# Patient Record
Sex: Female | Born: 1935 | Race: White | Hispanic: No | Smoking: Never smoker
Health system: Southern US, Community
[De-identification: ages and names within clinical notes are randomized; demographics above are authoritative.]

## PROBLEM LIST (undated history)

## (undated) DIAGNOSIS — J189 Pneumonia, unspecified organism: Secondary | ICD-10-CM

## (undated) DIAGNOSIS — K56609 Unspecified intestinal obstruction, unspecified as to partial versus complete obstruction: Secondary | ICD-10-CM

## (undated) DIAGNOSIS — D649 Anemia, unspecified: Secondary | ICD-10-CM

## (undated) DIAGNOSIS — F419 Anxiety disorder, unspecified: Secondary | ICD-10-CM

## (undated) DIAGNOSIS — C4330 Malignant melanoma of unspecified part of face: Secondary | ICD-10-CM

## (undated) DIAGNOSIS — G8929 Other chronic pain: Secondary | ICD-10-CM

## (undated) DIAGNOSIS — Z9289 Personal history of other medical treatment: Secondary | ICD-10-CM

## (undated) DIAGNOSIS — M1990 Unspecified osteoarthritis, unspecified site: Secondary | ICD-10-CM

## (undated) DIAGNOSIS — M545 Low back pain, unspecified: Secondary | ICD-10-CM

## (undated) HISTORY — PX: MELANOMA EXCISION: SHX5266

## (undated) HISTORY — PX: ABDOMINAL EXPLORATION SURGERY: SHX538

---

## 2001-07-04 ENCOUNTER — Ambulatory Visit (HOSPITAL_COMMUNITY): Admission: RE | Admit: 2001-07-04 | Discharge: 2001-07-04 | Payer: Self-pay | Admitting: Gastroenterology

## 2001-09-12 ENCOUNTER — Ambulatory Visit (HOSPITAL_COMMUNITY): Admission: RE | Admit: 2001-09-12 | Discharge: 2001-09-12 | Payer: Self-pay | Admitting: Ophthalmology

## 2003-07-18 ENCOUNTER — Encounter: Payer: Self-pay | Admitting: Ophthalmology

## 2003-07-23 ENCOUNTER — Ambulatory Visit (HOSPITAL_COMMUNITY): Admission: RE | Admit: 2003-07-23 | Discharge: 2003-07-23 | Payer: Self-pay | Admitting: Ophthalmology

## 2004-01-11 ENCOUNTER — Inpatient Hospital Stay (HOSPITAL_COMMUNITY): Admission: EM | Admit: 2004-01-11 | Discharge: 2004-01-17 | Payer: Self-pay | Admitting: *Deleted

## 2004-01-30 ENCOUNTER — Inpatient Hospital Stay (HOSPITAL_COMMUNITY): Admission: EM | Admit: 2004-01-30 | Discharge: 2004-02-12 | Payer: Self-pay | Admitting: Emergency Medicine

## 2004-02-20 ENCOUNTER — Encounter: Admission: RE | Admit: 2004-02-20 | Discharge: 2004-02-20 | Payer: Self-pay | Admitting: Gastroenterology

## 2004-07-07 ENCOUNTER — Inpatient Hospital Stay (HOSPITAL_COMMUNITY): Admission: EM | Admit: 2004-07-07 | Discharge: 2004-07-10 | Payer: Self-pay | Admitting: Physical Therapy

## 2004-08-10 ENCOUNTER — Ambulatory Visit (HOSPITAL_BASED_OUTPATIENT_CLINIC_OR_DEPARTMENT_OTHER): Admission: RE | Admit: 2004-08-10 | Discharge: 2004-08-10 | Payer: Self-pay | Admitting: Plastic Surgery

## 2004-08-10 ENCOUNTER — Ambulatory Visit (HOSPITAL_COMMUNITY): Admission: RE | Admit: 2004-08-10 | Discharge: 2004-08-10 | Payer: Self-pay | Admitting: Plastic Surgery

## 2004-10-26 ENCOUNTER — Other Ambulatory Visit: Admission: RE | Admit: 2004-10-26 | Discharge: 2004-10-26 | Payer: Self-pay | Admitting: Internal Medicine

## 2015-05-19 ENCOUNTER — Inpatient Hospital Stay (HOSPITAL_COMMUNITY)
Admission: EM | Admit: 2015-05-19 | Discharge: 2015-05-22 | DRG: 379 | Disposition: A | Discharge status: Skilled Nursing Facility | Payer: Medicare Other | Attending: Internal Medicine | Admitting: Internal Medicine

## 2015-05-19 ENCOUNTER — Encounter (HOSPITAL_COMMUNITY): Payer: Self-pay | Admitting: Emergency Medicine

## 2015-05-19 ENCOUNTER — Emergency Department (HOSPITAL_COMMUNITY): Payer: Medicare Other

## 2015-05-19 DIAGNOSIS — K922 Gastrointestinal hemorrhage, unspecified: Secondary | ICD-10-CM | POA: Diagnosis present

## 2015-05-19 DIAGNOSIS — D500 Iron deficiency anemia secondary to blood loss (chronic): Secondary | ICD-10-CM

## 2015-05-19 DIAGNOSIS — S51011A Laceration without foreign body of right elbow, initial encounter: Secondary | ICD-10-CM | POA: Diagnosis present

## 2015-05-19 DIAGNOSIS — Z66 Do not resuscitate: Secondary | ICD-10-CM | POA: Diagnosis present

## 2015-05-19 DIAGNOSIS — G8929 Other chronic pain: Secondary | ICD-10-CM | POA: Diagnosis present

## 2015-05-19 DIAGNOSIS — R55 Syncope and collapse: Secondary | ICD-10-CM | POA: Diagnosis not present

## 2015-05-19 DIAGNOSIS — M545 Low back pain: Secondary | ICD-10-CM | POA: Diagnosis present

## 2015-05-19 DIAGNOSIS — Z85820 Personal history of malignant melanoma of skin: Secondary | ICD-10-CM | POA: Diagnosis not present

## 2015-05-19 DIAGNOSIS — Z9289 Personal history of other medical treatment: Secondary | ICD-10-CM

## 2015-05-19 DIAGNOSIS — W19XXXA Unspecified fall, initial encounter: Secondary | ICD-10-CM | POA: Diagnosis present

## 2015-05-19 DIAGNOSIS — M62838 Other muscle spasm: Secondary | ICD-10-CM | POA: Diagnosis present

## 2015-05-19 DIAGNOSIS — F0390 Unspecified dementia without behavioral disturbance: Secondary | ICD-10-CM | POA: Diagnosis present

## 2015-05-19 DIAGNOSIS — D62 Acute posthemorrhagic anemia: Secondary | ICD-10-CM | POA: Diagnosis not present

## 2015-05-19 DIAGNOSIS — T39395A Adverse effect of other nonsteroidal anti-inflammatory drugs [NSAID], initial encounter: Secondary | ICD-10-CM | POA: Diagnosis present

## 2015-05-19 DIAGNOSIS — Y92009 Unspecified place in unspecified non-institutional (private) residence as the place of occurrence of the external cause: Secondary | ICD-10-CM

## 2015-05-19 DIAGNOSIS — M549 Dorsalgia, unspecified: Secondary | ICD-10-CM | POA: Insufficient documentation

## 2015-05-19 DIAGNOSIS — W102XXA Fall (on)(from) incline, initial encounter: Secondary | ICD-10-CM | POA: Insufficient documentation

## 2015-05-19 DIAGNOSIS — Z96642 Presence of left artificial hip joint: Secondary | ICD-10-CM | POA: Diagnosis present

## 2015-05-19 DIAGNOSIS — D649 Anemia, unspecified: Secondary | ICD-10-CM | POA: Diagnosis present

## 2015-05-19 HISTORY — DX: Pneumonia, unspecified organism: J18.9

## 2015-05-19 HISTORY — DX: Malignant melanoma of unspecified part of face: C43.30

## 2015-05-19 HISTORY — DX: Anemia, unspecified: D64.9

## 2015-05-19 HISTORY — DX: Low back pain, unspecified: M54.50

## 2015-05-19 HISTORY — DX: Personal history of other medical treatment: Z92.89

## 2015-05-19 HISTORY — DX: Other chronic pain: G89.29

## 2015-05-19 HISTORY — DX: Low back pain: M54.5

## 2015-05-19 HISTORY — DX: Unspecified osteoarthritis, unspecified site: M19.90

## 2015-05-19 HISTORY — DX: Anxiety disorder, unspecified: F41.9

## 2015-05-19 LAB — URINALYSIS, ROUTINE W REFLEX MICROSCOPIC
Bilirubin Urine: NEGATIVE
Glucose, UA: NEGATIVE mg/dL
HGB URINE DIPSTICK: NEGATIVE
KETONES UR: NEGATIVE mg/dL
LEUKOCYTES UA: NEGATIVE
Nitrite: NEGATIVE
Protein, ur: NEGATIVE mg/dL
Specific Gravity, Urine: 1.019 (ref 1.005–1.030)
Urobilinogen, UA: 0.2 mg/dL (ref 0.0–1.0)
pH: 7 (ref 5.0–8.0)

## 2015-05-19 LAB — CBC WITH DIFFERENTIAL/PLATELET
BASOS ABS: 0 10*3/uL (ref 0.0–0.1)
BASOS PCT: 0 % (ref 0–1)
EOS ABS: 0 10*3/uL (ref 0.0–0.7)
Eosinophils Relative: 0 % (ref 0–5)
HEMATOCRIT: 20.3 % — AB (ref 36.0–46.0)
Hemoglobin: 6.4 g/dL — CL (ref 12.0–15.0)
LYMPHS ABS: 0.5 10*3/uL — AB (ref 0.7–4.0)
LYMPHS PCT: 8 % — AB (ref 12–46)
MCH: 26.4 pg (ref 26.0–34.0)
MCHC: 31.5 g/dL (ref 30.0–36.0)
MCV: 83.9 fL (ref 78.0–100.0)
MONOS PCT: 4 % (ref 3–12)
Monocytes Absolute: 0.3 10*3/uL (ref 0.1–1.0)
NEUTROS ABS: 5.6 10*3/uL (ref 1.7–7.7)
Neutrophils Relative %: 88 % — ABNORMAL HIGH (ref 43–77)
PLATELETS: 253 10*3/uL (ref 150–400)
RBC: 2.42 MIL/uL — AB (ref 3.87–5.11)
RDW: 14 % (ref 11.5–15.5)
WBC: 6.3 10*3/uL (ref 4.0–10.5)

## 2015-05-19 LAB — COMPREHENSIVE METABOLIC PANEL
ALBUMIN: 2.7 g/dL — AB (ref 3.5–5.0)
ALK PHOS: 57 U/L (ref 38–126)
ALT: 11 U/L — AB (ref 14–54)
AST: 18 U/L (ref 15–41)
Anion gap: 6 (ref 5–15)
BUN: 11 mg/dL (ref 6–20)
CALCIUM: 7.7 mg/dL — AB (ref 8.9–10.3)
CO2: 24 mmol/L (ref 22–32)
Chloride: 99 mmol/L — ABNORMAL LOW (ref 101–111)
Creatinine, Ser: 1.06 mg/dL — ABNORMAL HIGH (ref 0.44–1.00)
GFR calc Af Amer: 50 mL/min — ABNORMAL LOW (ref >60)
GFR calc non Af Amer: 44 mL/min — ABNORMAL LOW (ref >60)
GLUCOSE: 134 mg/dL — AB (ref 65–99)
POTASSIUM: 4.8 mmol/L (ref 3.5–5.1)
SODIUM: 129 mmol/L — AB (ref 135–145)
TOTAL PROTEIN: 4.6 g/dL — AB (ref 6.5–8.1)
Total Bilirubin: 0.3 mg/dL (ref 0.3–1.2)

## 2015-05-19 LAB — RETICULOCYTES
RBC.: 2.8 MIL/uL — ABNORMAL LOW (ref 3.87–5.11)
RETIC COUNT ABSOLUTE: 36.4 10*3/uL (ref 19.0–186.0)
Retic Ct Pct: 1.3 % (ref 0.4–3.1)

## 2015-05-19 LAB — OCCULT BLOOD, POC DEVICE: Fecal Occult Bld: POSITIVE — AB

## 2015-05-19 LAB — CBG MONITORING, ED: GLUCOSE-CAPILLARY: 141 mg/dL — AB (ref 65–99)

## 2015-05-19 LAB — PREPARE RBC (CROSSMATCH)

## 2015-05-19 LAB — ABO/RH: ABO/RH(D): O POS

## 2015-05-19 MED ORDER — SODIUM CHLORIDE 0.9 % IV SOLN: 10.0000 mL/h | Freq: Once | INTRAVENOUS | Status: DC

## 2015-05-19 MED ORDER — SODIUM CHLORIDE 0.9 % IV BOLUS (SEPSIS)
1000.0000 mL | Freq: Once | INTRAVENOUS | Status: AC
  Administered 2015-05-19: 1000 mL via INTRAVENOUS

## 2015-05-19 MED ORDER — ACETAMINOPHEN 650 MG RE SUPP: 650.0000 mg | Freq: Four times a day (QID) | RECTAL | Status: DC | PRN

## 2015-05-19 MED ORDER — TRAMADOL HCL 50 MG PO TABS
50.0000 mg | ORAL_TABLET | Freq: Two times a day (BID) | ORAL | Status: DC | PRN
  Administered 2015-05-20 – 2015-05-21 (×2): 50 mg via ORAL
  Filled 2015-05-19 (×3): qty 1

## 2015-05-19 MED ORDER — ACETAMINOPHEN 325 MG PO TABS
650.0000 mg | ORAL_TABLET | Freq: Four times a day (QID) | ORAL | Status: DC | PRN
Start: 2015-05-19 — End: 2015-05-22
  Administered 2015-05-19: 650 mg via ORAL
  Filled 2015-05-19 (×2): qty 2

## 2015-05-19 MED ORDER — SODIUM CHLORIDE 0.9 % IJ SOLN
3.0000 mL | Freq: Two times a day (BID) | INTRAMUSCULAR | Status: DC
  Administered 2015-05-19 – 2015-05-22 (×5): 3 mL via INTRAVENOUS

## 2015-05-19 MED ORDER — SODIUM CHLORIDE 0.9 % IV SOLN
INTRAVENOUS | Status: AC
  Administered 2015-05-19 – 2015-05-20 (×2): via INTRAVENOUS

## 2015-05-19 NOTE — ED Notes (Signed)
Admitting at bedside 

## 2015-05-19 NOTE — ED Provider Notes (Signed)
I saw and evaluated the patient, reviewed the resident's note and I agree with the findings and plan.   EKG Interpretation   Date/Time:  Monday May 19 2015 18:25:49 EDT Ventricular Rate:  64 PR Interval:  125 QRS Duration: 73 QT Interval:  432 QTC Calculation: 446 R Axis:   28 Text Interpretation:  Sinus rhythm Anteroseptal infarct, old ED PHYSICIAN  INTERPRETATION AVAILABLE IN [facility] [place] Confirmed by TEST, Record  ([id number]) on 05/20/2015 6:46:28 AM      89yF with syncope? Found down at home. Pt not sure of exact circumstances. Did get a dose of zanaflex for first time today for back pain though.  Second fall in past week. Also noted to have some confusion which began last week. Diagnosed with UTI at that time. Tx'd and confusion improved up until today. Just not as quick to respond to questions as typically is. W/u significant for hemoglobin of 6.4. This certainly could be contributing. Will transfuse. Admission.  CRITICAL CARE Performed by: Virgel Manifold Total critical care time: 35 minutes Critical care time was exclusive of separately billable procedures and treating other patients. Critical care was necessary to treat or prevent imminent or life-threatening deterioration. Critical care was time spent personally by me on the following activities: development of treatment plan with patient and/or surrogate as well as nursing, discussions with consultants, evaluation of patient's response to treatment, examination of patient, obtaining history from patient or surrogate, ordering and performing treatments and interventions, ordering and review of laboratory studies, ordering and review of radiographic studies, pulse oximetry and re-evaluation of patient's condition.   Virgel Manifold, MD 05/21/15 [phone]

## 2015-05-19 NOTE — ED Notes (Signed)
Pt went to MD office today for possible UTI. Pt placed on anitbiotic and zanaflex. Pt is not on any other medications. Pt's family left  Her this morning then came home and found her unresponsive on kitchen floor. Pt took 1 zanaflex. Family was able to help pt up and sit her in chair. Pt became more responsive- eye opening. Pt was bagged by fire at some points- pt would stop breathing without stimulation. Pupils constricted. EMS gave 0.5mg  narcan. Pt seemed to respond more. Pt had second dose of narcan 0.5mg . Pt unable to recall the year, but is able to answer all other questions now. Pt is very lethargic upon arrival. Pt complaining of her neck feeling "tired." Pt has skin tears on right elbow and left forearm. BP 70/42 initially for EMS, HR 50 CBG 158

## 2015-05-19 NOTE — ED Notes (Signed)
Phlebotomy made aware this RN unable to pull blood from IV.

## 2015-05-19 NOTE — ED Notes (Signed)
MD at bedside updating family.  

## 2015-05-19 NOTE — ED Provider Notes (Signed)
History   Chief Complaint  Patient presents with  . Fall    HPI 81 year old female past history of dementia presents to ED for evaluation of fall. Patient arrives via EMS. EMS reports patient was found to have initial blood pressure in the 91M systolic and was given IV fluids. She had small skin tears to her right elbow and left forearm. Patient appeared confused. Patient remained stable during transport. Patient is complaining of neck pain and has no other complaints currently. Patient's niece is present in ED and says that the patient's other niece found patient at home after she fell today. She noted that the patient was confused. She says around 3:00 she gave the patient Xanaflex for the first time. Patient does not have access to any narcotics. Patient has never had Xanaflex in the past and this was just recently prescribed by her PCP for back pain. Additionally she notes the patient was recently diagnosed with UTI. She said patient appeared slightly dazed and confused. Niece is present in ED now since patient is acting more normal now. She notes that patient has long-standing history of back pain as well as neck pain. She thinks patient's speech appears normal. History is limited secondary patient's confusion and history of dementia.   Past medical/surgical history, social history, medications, allergies and FH have been reviewed with patient and/or in documentation.  History reviewed. No pertinent past medical history. History reviewed. No pertinent past surgical history. No family history on file. History  Substance Use Topics  . Smoking status: Never Smoker   . Smokeless tobacco: Not on file  . Alcohol Use: No     Review of Systems Unable to obtain secondary to patient's altered mental status  Physical Exam  Physical Exam ED Triage Vitals  Enc Vitals Group     BP 05/19/15 1809 94/53 mmHg     Pulse Rate 05/19/15 1809 65     Resp 05/19/15 1809 16     Temp 05/19/15 1809 97.4  F (36.3 C)     Temp Source 05/19/15 1809 Oral     SpO2 05/19/15 1809 99 %     Weight 05/19/15 1815 96 lb (43.545 kg)     Height 05/19/15 1815 5\' 3"  (1.6 m)     Head Cir --      Peak Flow --      Pain Score --      Pain Loc --      Pain Edu? --      Excl. in [place]? --     General: awake. Alert and oriented to person and place only. Well-hydrated. Appears confused.  HENT:  [place]/AT and no palpable skull defect; pupils 2 mm, equal, round, reactive; EOMs intact. No signs of ocular entrapment, Battle sign, raccoon eyes, nasal septal hematoma, hemotympanum, midface instability or deformity, apparent oral injury Neck: supple, trachea midline, cervical collar in place, no midline C spine ttp. Mild right lateral posterior neck tenderness to palpation.  Cardio: RRR.  No JVD.  2+ pulses in bilateral upper and lower extremities. No peripheral edema. Pulm:   CTAB, no r/r/g. Normal respiratory effort Chest wall: stable to AP/LAT compression, chest wall non-tender, no obvious clavicle deformity Abd: soft, NT/ND. MSK: Extremities have FROM, are nontender, & NVI distally. Pt has skin tears to R elbow and L forearm. Spine: without obvious step off, tenderness or signs of injury.  Neuro: GCS 14. No focal deficit. Normal strength/sensation/muscle tone.   ED Course  Procedures   Labs Reviewed  CBC WITH DIFFERENTIAL/PLATELET - Abnormal; Notable for the following:    RBC 2.42 (*)    Hemoglobin 6.4 (*)    HCT 20.3 (*)    Neutrophils Relative % 88 (*)    Lymphocytes Relative 8 (*)    Lymphs Abs 0.5 (*)    All other components within normal limits  COMPREHENSIVE METABOLIC PANEL - Abnormal; Notable for the following:    Sodium 129 (*)    Chloride 99 (*)    Glucose, Bld 134 (*)    Creatinine, Ser 1.06 (*)    Calcium 7.7 (*)    Total Protein 4.6 (*)    Albumin 2.7 (*)    ALT 11 (*)    GFR calc non Af Amer 44 (*)    GFR calc Af Amer 50 (*)    All other components within normal limits  RETICULOCYTES -  Abnormal; Notable for the following:    RBC. 2.80 (*)    All other components within normal limits  IRON AND TIBC - Abnormal; Notable for the following:    Iron 9 (*)    Saturation Ratios 3 (*)    All other components within normal limits  CBG MONITORING, ED - Abnormal; Notable for the following:    Glucose-Capillary 141 (*)    All other components within normal limits  OCCULT BLOOD, POC DEVICE - Abnormal; Notable for the following:    Fecal Occult Bld POSITIVE (*)    All other components within normal limits  URINALYSIS, ROUTINE W REFLEX MICROSCOPIC (NOT AT [facility])  FERRITIN  BASIC METABOLIC PANEL  CBC  PROTIME-INR  POC OCCULT BLOOD, ED  PREPARE RBC (CROSSMATCH)  TYPE AND SCREEN  ABO/RH   I personally reviewed and interpreted all labs.  CT Head Wo Contrast    (Results Pending)  CT Cervical Spine Wo Contrast    (Results Pending)   I personally viewed above image(s) which were used in my medical decision making. Formal interpretations by Radiology.  MDM:  On arrival, patient is hemodynamically stable and in no apparent distress. She is confused. Her initial blood pressure was 96 systolic. IV fluids were started. Patient had a benign exam as above with minor skin tears to extremities. No trauma to head. Given her fall history a CT head and C-spine were ordered. Screening labs were sent. Initial EKG was unremarkable. Patient's screening labs are notable for hemoglobin of 6.4. Patient has chronic dark stools as she is on iron for anemia. No change in that per family. Hemoccults sent and pending. Patient blood pressure after fluids were 016W systolic. Patient received 1 unit of packed red blood cells in ED. Imaging was negative for trauma or other abnormality. Patient will be admitted to internal medicine teaching service.  Clinical Impression: 1. Anemia, unspecified anemia type   2. Fall (on)(from) incline, initial encounter     Disposition: Admit  Condition: stable  I have  discussed the results, Dx and Tx plan with the pt(& family if present). He/she/they expressed understanding and agree(s) with the plan.  Pt seen in conjunction with Dr. Benny Lennert, [place] [facility] Resident - PGY-3     Kirstie Peri, MD 05/20/15 1093  Virgel Manifold, MD 05/20/15 [phone]

## 2015-05-19 NOTE — ED Notes (Signed)
Phlebotomy at bedside.

## 2015-05-19 NOTE — ED Notes (Signed)
MD made aware of Hgb and Hct.

## 2015-05-19 NOTE — ED Notes (Signed)
MD made aware CT results are back and that pt's family have been discussing with her in the room about the need to sign a DNR.

## 2015-05-19 NOTE — H&P (Signed)
Date: 05/20/2015               Patient Name:  Kelly Porter MRN: [id number]  DOB: [birth year] Age / Sex: 81 y.o., female   PCP: Josetta Huddle, MD         Medical Service: Internal Medicine Teaching Service         Attending Physician: Dr. Oval Linsey, MD    First Contact: Kalman Shan, MS-IV Pager: [phone]  Second Contact: Dr. Duwaine Maxin Pager: [phone]       After Hours (After 5p/  First Contact Pager: [phone]  weekends / holidays): Second Contact Pager: [phone]   Chief Complaint: Syncope and anemia    History of Present Illness: Ms. Kelly Porter is a 81 y.o. female with a past medical history of dementia, diverticulosis who presents to the emergency department with syncope.  HPI is provided by both the patient and the patients niece who was present with [name].  Per patients niece, patient has been increasingly weak, fatigued, and confused for approximately the past month.  Reports that she has also had a couple of falls during this time associated with dizziness and being light-headed.  Also reports last Saturday 7/23 an episode of watery, non-bloody diarrhea.  She was subsequently seen at an urgent care the following day where she had a hemoglobin of at least 9.  Today, on admission her hemoglobin is 6.4, hematocrit of 20.3.   Pt reports black stools recently but does not recall any bright red blood per rectum and no hematemesis.  To coincide with her increased fatigue and confusion, patient also complains of increased back pain for two weeks localized to the thoracolumbar area of her spine.  For this she was recently seen by her physician and given prescription for Zanaflex.  Today, patient took her Zanaflex as prescribed and was unattended for a period of 45-60 minutes at which point she was found on the ground by relatives unable to get up.  During the exam patient was alert and oriented to person only.  Meds: Current Facility-Administered Medications  Medication Dose Route  Frequency Provider Last Rate Last Dose  . 0.9 %  sodium chloride infusion   Intravenous Continuous Corky Sox, MD 75 mL/hr at 05/19/15 2215    . acetaminophen (TYLENOL) tablet 650 mg  650 mg Oral Q6H PRN Corky Sox, MD   650 mg at 05/19/15 2229   Or  . acetaminophen (TYLENOL) suppository 650 mg  650 mg Rectal Q6H PRN Corky Sox, MD      . antiseptic oral rinse (CPC / CETYLPYRIDINIUM CHLORIDE 0.05%) solution 7 mL  7 mL Mouth Rinse BID Oval Linsey, MD   7 mL at 05/20/15 0246  . sodium chloride 0.9 % injection 3 mL  3 mL Intravenous Q12H Corky Sox, MD   3 mL at 05/19/15 2215  . traMADol (ULTRAM) tablet 50 mg  50 mg Oral Q12H PRN Corky Sox, MD   50 mg at 05/20/15 0045    Allergies: Allergies as of 05/19/2015  . (No Known Allergies)   Past Medical History  Diagnosis Date  . Pneumonia X 1  . History of blood transfusion 05/19/2015    "low HgB"  . Anemia "always"  . Arthritis     "back" (05/19/2015)  . Chronic lower back pain     "qd for the last month" (05/19/2015)  . Anxiety   . Melanoma of face     "skin only;  no other treatment"   Past Surgical History  Procedure Laterality Date  . Abdominal exploration surgery      "thought it was her ovaries; found diverticulitis"  . Melanoma excision      face   History reviewed. No pertinent family history. History   Social History  . Marital Status: Widowed    Spouse Name: N/A  . Number of Children: N/A  . Years of Education: N/A   Occupational History  . Not on file.   Social History Main Topics  . Smoking status: Never Smoker   . Smokeless tobacco: Never Used  . Alcohol Use: No  . Drug Use: No  . Sexual Activity: No   Other Topics Concern  . Not on file   Social History Narrative  . No narrative on file    Review of Systems  Constitutional: Positive for malaise/fatigue. Negative for fever and chills.  Respiratory: Negative for cough, hemoptysis and shortness of breath.   Cardiovascular: Negative for chest  pain and leg swelling.  Gastrointestinal: Positive for diarrhea and melena. Negative for vomiting and abdominal pain.  Genitourinary: Negative for dysuria.  Musculoskeletal: Positive for back pain.  Skin:       Skin laceration on left forearm and right elbow.  Neurological: Positive for dizziness.  Psychiatric/Behavioral: Positive for memory loss.    Physical Exam: Blood pressure 143/67, pulse 81, temperature 97.5 F (36.4 C), temperature source Oral, resp. rate 19, height 5\' 3"  (1.6 m), weight 102 lb (46.267 kg), SpO2 98 %. Physical Exam  Constitutional: She appears well-developed and well-nourished. No distress.  HENT:  Head: Normocephalic and atraumatic.  Cardiovascular: Normal rate, regular rhythm, normal heart sounds and intact distal pulses.   Pulmonary/Chest: Effort normal and breath sounds normal.  Abdominal: Soft. Bowel sounds are normal. She exhibits no distension. There is no tenderness.  Musculoskeletal: She exhibits tenderness. She exhibits no edema.  Thoracolumbar tenderness to palpation and pain with movement while lying in the hospital bed.  Neurological: She is alert. She has normal strength. She displays normal reflexes. No cranial nerve deficit.  Oriented only to person. Normal finger-to-nose.  Skin:  Skin laceration visible on right elbow, left forearm.  Ecchymosis noted on bilateral lower extremities that is unchanged.    Imaging results:  Ct Head Wo Contrast  05/19/2015   CLINICAL DATA:  Patient found at home unresponsive on floor, constricted pupils, skin tear is on right elbow and left forearm suggesting followup, on antibiotics for urinary tract infection  EXAM: CT HEAD WITHOUT CONTRAST  CT CERVICAL SPINE WITHOUT CONTRAST  TECHNIQUE: Multidetector CT imaging of the head and cervical spine was performed following the standard protocol without intravenous contrast. Multiplanar CT image reconstructions of the cervical spine were also generated.  COMPARISON:   01/12/2004  FINDINGS: CT HEAD FINDINGS  Severe atrophy. Moderate low attenuation in the deep white matter. No focal findings to suggest mass or transcortical infarct. No hemorrhage or extra-axial fluid. No hydrocephalus. No skull fracture.  CT CERVICAL SPINE FINDINGS  No cervical spine fracture. No acute soft tissue abnormalities. Carotid artery calcification on the right. Thyroid normal. Lung apices clear. Normal alignment. Multilevel degenerative disc disease.  IMPRESSION: No acute intracranial abnormalities.  No cervical spine fracture.   Electronically Signed   By: Skipper Cliche M.D.   On: 05/19/2015 19:41   Ct Cervical Spine Wo Contrast  05/19/2015   CLINICAL DATA:  Patient found at home unresponsive on floor, constricted pupils, skin tear is on right elbow and  left forearm suggesting followup, on antibiotics for urinary tract infection  EXAM: CT HEAD WITHOUT CONTRAST  CT CERVICAL SPINE WITHOUT CONTRAST  TECHNIQUE: Multidetector CT imaging of the head and cervical spine was performed following the standard protocol without intravenous contrast. Multiplanar CT image reconstructions of the cervical spine were also generated.  COMPARISON:  01/12/2004  FINDINGS: CT HEAD FINDINGS  Severe atrophy. Moderate low attenuation in the deep white matter. No focal findings to suggest mass or transcortical infarct. No hemorrhage or extra-axial fluid. No hydrocephalus. No skull fracture.  CT CERVICAL SPINE FINDINGS  No cervical spine fracture. No acute soft tissue abnormalities. Carotid artery calcification on the right. Thyroid normal. Lung apices clear. Normal alignment. Multilevel degenerative disc disease.  IMPRESSION: No acute intracranial abnormalities.  No cervical spine fracture.   Electronically Signed   By: Skipper Cliche M.D.   On: 05/19/2015 19:41    Assessment & Plan by Problem: Active Problems:   Anemia   Syncope  Anemia: -Patients niece reports hemoglobin last week of at least 9 with patient  reporting black stools, increased fatigue, dizziness and being light-headed x 1 month. -H/H on admission of 6.4/20.3.   -will give 1 unit RBC, NS IV fluids @ 22mL/hr. -check CBC, BMP, PT/INR in the AM.  Consideration for more RBC if hemoglobin still < 7.0. -FOBT positive, concern for GI source.  Consider GI consult in AM.  -Pt has hx of iron deficiency, on supplementation at home, borderline microcytic anemia with MCV 83.9.  Iron panel indicative of iron deficiency anemia with iron of 9 and normal ferritin.  After she is seen by GI, consider starting ferrous sulfate 325 tid.  Syncope: -CT Head and cervical spine without acute intracranial abnormalities or cervical spine fracture.   -patient found down at home.  Concern for orthostatic syncope due to prodrome of associated dizziness and lightheadedness.  Pt does not have history of cardiac disease. -order for orthostatic vitals in the AM. -neuro checks q4h. -hold Zanaflex.  Back Pain: -several months of increasing thoracolumbar pain tender to palpation and aggravated by movement, seen by outside physician, given rx for Zanaflex. -urinalysis does not show UTI. -acetaminophen 650mg  q6h prn for pain. -tramadol 50mg  q12h prn for pain. -hold Zanaflex for now 2/2 to potential relationship to syncopal episode. -PT/OT to evaluate and treat. -consider x-ray of thoracolumbar spine.  Dementia:  -patient is pleasantly demented but lives alone and is primary caretaker for herself. -consult to social work for possible skilled nursing home facility given patients decline in for the past month with confusion, weakness and fatigue.  DVT Prophlaxis: -SCDs  Diet: -clear diet  Code Status: -DNR (discussed with niece and patient at bedside)  Dispo: Disposition is deferred at this time, awaiting improvement of current medical problems. Anticipated discharge in approximately 2-3 day(s).   The patient does have a current PCP Josetta Huddle, MD) and does  not need an [facility] hospital follow-up appointment after discharge.  The patient does have transportation limitations that hinder transportation to clinic appointments.  Signed: Jule Ser, DO 05/20/2015, 4:19 AM

## 2015-05-19 NOTE — ED Notes (Signed)
Pt alert/oriented to baseline.

## 2015-05-20 ENCOUNTER — Inpatient Hospital Stay (HOSPITAL_COMMUNITY): Payer: Medicare Other

## 2015-05-20 DIAGNOSIS — K922 Gastrointestinal hemorrhage, unspecified: Principal | ICD-10-CM

## 2015-05-20 DIAGNOSIS — D62 Acute posthemorrhagic anemia: Secondary | ICD-10-CM

## 2015-05-20 DIAGNOSIS — R55 Syncope and collapse: Secondary | ICD-10-CM

## 2015-05-20 DIAGNOSIS — R296 Repeated falls: Secondary | ICD-10-CM

## 2015-05-20 DIAGNOSIS — Z9181 History of falling: Secondary | ICD-10-CM

## 2015-05-20 DIAGNOSIS — W102XXA Fall (on)(from) incline, initial encounter: Secondary | ICD-10-CM | POA: Insufficient documentation

## 2015-05-20 DIAGNOSIS — D500 Iron deficiency anemia secondary to blood loss (chronic): Secondary | ICD-10-CM

## 2015-05-20 DIAGNOSIS — F0390 Unspecified dementia without behavioral disturbance: Secondary | ICD-10-CM

## 2015-05-20 DIAGNOSIS — M545 Low back pain: Secondary | ICD-10-CM

## 2015-05-20 DIAGNOSIS — M549 Dorsalgia, unspecified: Secondary | ICD-10-CM | POA: Insufficient documentation

## 2015-05-20 LAB — PROTIME-INR
INR: 1.09 (ref 0.00–1.49)
PROTHROMBIN TIME: 14.3 s (ref 11.6–15.2)

## 2015-05-20 LAB — CBC
HCT: 25.3 % — ABNORMAL LOW (ref 36.0–46.0)
HEMOGLOBIN: 8.2 g/dL — AB (ref 12.0–15.0)
MCH: 27.6 pg (ref 26.0–34.0)
MCHC: 32.4 g/dL (ref 30.0–36.0)
MCV: 85.2 fL (ref 78.0–100.0)
Platelets: 262 10*3/uL (ref 150–400)
RBC: 2.97 MIL/uL — AB (ref 3.87–5.11)
RDW: 14.1 % (ref 11.5–15.5)
WBC: 6.7 10*3/uL (ref 4.0–10.5)

## 2015-05-20 LAB — BASIC METABOLIC PANEL
Anion gap: 8 (ref 5–15)
BUN: 12 mg/dL (ref 6–20)
CALCIUM: 7.8 mg/dL — AB (ref 8.9–10.3)
CO2: 22 mmol/L (ref 22–32)
Chloride: 102 mmol/L (ref 101–111)
Creatinine, Ser: 0.94 mg/dL (ref 0.44–1.00)
GFR calc non Af Amer: 50 mL/min — ABNORMAL LOW (ref >60)
GFR, EST AFRICAN AMERICAN: 58 mL/min — AB (ref >60)
GLUCOSE: 91 mg/dL (ref 65–99)
Potassium: 4 mmol/L (ref 3.5–5.1)
Sodium: 132 mmol/L — ABNORMAL LOW (ref 135–145)

## 2015-05-20 LAB — IRON AND TIBC
IRON: 9 ug/dL — AB (ref 28–170)
SATURATION RATIOS: 3 % — AB (ref 10.4–31.8)
TIBC: 330 ug/dL (ref 250–450)
UIBC: 321 ug/dL

## 2015-05-20 LAB — TYPE AND SCREEN
ABO/RH(D): O POS
Antibody Screen: NEGATIVE
UNIT DIVISION: 0

## 2015-05-20 LAB — FERRITIN: Ferritin: 14 ng/mL (ref 11–307)

## 2015-05-20 MED ORDER — PANTOPRAZOLE SODIUM 40 MG IV SOLR
40.0000 mg | Freq: Two times a day (BID) | INTRAVENOUS | Status: DC
  Administered 2015-05-20 – 2015-05-21 (×2): 40 mg via INTRAVENOUS
  Filled 2015-05-20 (×2): qty 40

## 2015-05-20 MED ORDER — SODIUM CHLORIDE 0.9 % IV SOLN
510.0000 mg | Freq: Once | INTRAVENOUS | Status: AC
  Administered 2015-05-20: 510 mg via INTRAVENOUS
  Filled 2015-05-20: qty 17

## 2015-05-20 MED ORDER — CETYLPYRIDINIUM CHLORIDE 0.05 % MT LIQD
7.0000 mL | Freq: Two times a day (BID) | OROMUCOSAL | Status: DC
  Administered 2015-05-20 – 2015-05-22 (×5): 7 mL via OROMUCOSAL

## 2015-05-20 NOTE — Evaluation (Signed)
Physical Therapy Evaluation Patient Details Name: CICELY ORTNER MRN: [id number] DOB: 1919/07/08 Today's Date: 05/20/2015   History of Present Illness  History of Present Illness: Ms. KARIANNE NOGUEIRA is a 81 y.o. female with a past medical history of dementia, diverticulosis who presents to the emergency department with syncope. HPI is provided by both the patient and the patients niece who was present with Ms. Theroux. Per patients niece, patient has been increasingly weak, fatigued, and confused for approximately the past month. Reports that she has also had a couple of falls during this time associated with dizziness and being light-headed. Also reports last Saturday 7/23 an episode of watery, non-bloody diarrhea. She was subsequently seen at an urgent care the following day where she had a hemoglobin of at least 9. On admission her hemoglobin is 6.4  Clinical Impression   Pt admitted with above diagnosis. Pt currently with functional limitations due to the deficits listed below (see PT Problem List).  Pt will benefit from skilled PT to increase their independence and safety with mobility to allow discharge to the venue listed below.       Follow Up Recommendations SNF    Equipment Recommendations  3in1 (PT)    Recommendations for Other Services OT consult     Precautions / Restrictions Precautions Precautions: Fall Precaution Comments: Consider following back precautions for comfort (MDs have oredered imaging of her spine)      Mobility  Bed Mobility Overal bed mobility: Needs Assistance Bed Mobility: Supine to Sit     Supine to sit: Min assist     General bed mobility comments: Cues for technique and min assist to pull to sit; used bedrails  Transfers Overall transfer level: Needs assistance Equipment used: 1 person hand held assist Transfers: Sit to/from Stand Sit to Stand: Min assist         General transfer comment: Minsteady assist; grimace and reports of  back pain with standing up; tending to reach out for UE support  Ambulation/Gait Ambulation/Gait assistance: Min assist Ambulation Distance (Feet): 25 Feet (to/from bathroom) Assistive device: 1 person hand held assist;Rolling walker (2 wheeled) Gait Pattern/deviations: Step-through pattern;Trunk flexed;Scissoring;Ataxic;Narrow base of support     General Gait Details: Tending to reach out for UE support with upright standing and walking; better with use of RW for bilateral UE support  Stairs            Wheelchair Mobility    Modified Rankin (Stroke Patients Only)       Balance Overall balance assessment: History of Falls                                           Pertinent Vitals/Pain Pain Assessment: Faces Faces Pain Scale: Hurts even more Pain Location: lower thoracic/upper lumbar spine Pain Descriptors / Indicators: Aching;Discomfort;Grimacing Pain Intervention(s): Limited activity within patient's tolerance;Monitored during session;Repositioned    Home Living Family/patient expects to be discharged to:: Skilled nursing facility                      Prior Function Level of Independence: Independent with assistive device(s)         Comments: Until recent bouts with falls was independent with cane; niece and nephew assisted with shopping     Hand Dominance        Extremity/Trunk Assessment   Upper Extremity Assessment: Generalized weakness  Lower Extremity Assessment: Generalized weakness      Cervical / Trunk Assessment:  (pain in thoracolumbar spine with moving)  Communication   Communication: No difficulties  Cognition Arousal/Alertness: Awake/alert Behavior During Therapy: WFL for tasks assessed/performed Overall Cognitive Status: No family/caregiver present to determine baseline cognitive functioning       Memory: Decreased short-term memory (Noted dementia in her history)              General  Comments General comments (skin integrity, edema, etc.): Incontinent of urine on teh way to the bathroom    Exercises        Assessment/Plan    PT Assessment Patient needs continued PT services  PT Diagnosis Difficulty walking;Abnormality of gait;Generalized weakness;Acute pain   PT Problem List Decreased strength;Decreased range of motion;Decreased activity tolerance;Decreased balance;Decreased mobility;Decreased coordination;Decreased cognition;Decreased knowledge of use of DME;Pain;Decreased safety awareness  PT Treatment Interventions DME instruction;Gait training;Stair training;Functional mobility training;Therapeutic activities;Therapeutic exercise;Patient/family education;Cognitive remediation   PT Goals (Current goals can be found in the Care Plan section) Acute Rehab PT Goals Patient Stated Goal: Hopes to be able to get better and go home PT Goal Formulation: With patient Time For Goal Achievement: 06/03/15 Potential to Achieve Goals: Good    Frequency Min 2X/week   Barriers to discharge Decreased caregiver support Lives alone    Co-evaluation               End of Session Equipment Utilized During Treatment: Gait belt Activity Tolerance: Patient tolerated treatment well;Other (comment) (though in pain) Patient left: in chair;with call bell/phone within reach;with chair alarm set Nurse Communication: Mobility status         Time: 4259-5638 PT Time Calculation (min) (ACUTE ONLY): 23 min   Charges:   PT Evaluation $Initial PT Evaluation Tier I: 1 Procedure PT Treatments $Gait Training: 8-22 mins   PT G Codes:        Quin Hoop 05/20/2015, 11:45 AM  Roney Marion, [place] Pager [phone] Office [phone]

## 2015-05-20 NOTE — Progress Notes (Signed)
New Admission Note:   Arrival Method:  Via stretcher from ED  Mental Orientation: Alert to self, place Telemetry: NSR Assessment: Completed Skin: Skin tear to left fore arm, bruises on bilateral arms IV: RFA NS@75  Pain: 9 out of 10 Right upper back Tubes:N/A Safety Measures: Safety Fall Prevention Plan has been given, discussed and signed Admission: Completed [facility] Orientation: Patient has been orientated to the room, unit and staff.  Family: Niece at Bedside  Orders have been reviewed and implemented. Will continue to monitor the patient. Call light has been placed within reach and bed alarm has been activated.   Dudley Major BSN, Consulting civil engineer number: [phone]

## 2015-05-20 NOTE — Evaluation (Signed)
Occupational Therapy Evaluation Patient Details Name: Kelly Porter MRN: [id number] DOB: 03/25/1919 Today's Date: 05/20/2015    History of Present Illness History of Present Illness: Kelly Porter is a 81 y.o. female with a past medical history of dementia, diverticulosis who presents to the emergency department with syncope. HPI is provided by both the patient and the patients niece who was present with Ms. Haigh. Per patients niece, patient has been increasingly weak, fatigued, and confused for approximately the past month. Reports that she has also had a couple of falls during this time associated with dizziness and being light-headed. Also reports last Saturday 7/23 an episode of watery, non-bloody diarrhea. She was subsequently seen at an urgent care the following day where she had a hemoglobin of at least 9. On admission her hemoglobin is 6.4   Clinical Impression   Pt was living alone and independent in self care, light housekeeping and light meal prep prior to admission.  Pt presents with impaired cognition, generalized weakness and decreased balance interfering with her ability to perform ADL and mobility. Pt will need post acute rehab prior to return home.  Will follow acutely.    Follow Up Recommendations  SNF;Supervision/Assistance - 24 hour    Equipment Recommendations       Recommendations for Other Services       Precautions / Restrictions Precautions Precautions: Fall Precaution Comments: Consider following back precautions for comfort (MDs have oredered imaging of her spine) Restrictions Weight Bearing Restrictions: No      Mobility Bed Mobility      General bed mobility comments: pt in chair  Transfers Overall transfer level: Needs assistance Equipment used: Rolling walker (2 wheeled) Transfers: Sit to/from Stand Sit to Stand: Supervision         General transfer comment: from recliner and toilet    Balance Overall balance assessment:  Needs assistance   Sitting balance-Leahy Scale: Good       Standing balance-Leahy Scale: Fair                              ADL Overall ADL's : Needs assistance/impaired Eating/Feeding: Independent;Sitting   Grooming: Wash/dry hands;Oral care;Supervision/safety;Standing   Upper Body Bathing: Set up;Sitting   Lower Body Bathing: Supervison/ safety;Sit to/from stand   Upper Body Dressing : Set up;Sitting   Lower Body Dressing: Supervision/safety;Sit to/from stand   Toilet Transfer: Min guard;Ambulation;RW   Toileting- Clothing Manipulation and Hygiene: Supervision/safety;Sit to/from stand       Functional mobility during ADLs: Min guard;Rolling walker General ADL Comments: Pt with urinary incontinence,     Vision     Perception     Praxis      Pertinent Vitals/Pain Pain Assessment: No/denies pain Faces Pain Scale: Hurts even more Pain Location: lower thoracic/upper lumbar spine Pain Descriptors / Indicators: Aching;Discomfort;Grimacing Pain Intervention(s): Limited activity within patient's tolerance;Monitored during session;Repositioned     Hand Dominance Right   Extremity/Trunk Assessment Upper Extremity Assessment Upper Extremity Assessment: Generalized weakness   Lower Extremity Assessment Lower Extremity Assessment: Generalized weakness   Cervical / Trunk Assessment Cervical / Trunk Assessment:  (pain in thoracolumbar spine with moving)   Communication Communication Communication: No difficulties   Cognition Arousal/Alertness: Awake/alert Behavior During Therapy: WFL for tasks assessed/performed Overall Cognitive Status: No family/caregiver present to determine baseline cognitive functioning       Memory: Decreased short-term memory  General Comments       Exercises       Shoulder Instructions      Home Living Family/patient expects to be discharged to:: Skilled nursing facility Living Arrangements: Alone                                       Prior Functioning/Environment Level of Independence: Independent with assistive device(s)        Comments: Until recent bouts with falls was independent with cane; niece and nephew assisted with shopping    OT Diagnosis: Generalized weakness;Cognitive deficits   OT Problem List: Decreased strength;Decreased activity tolerance;Impaired balance (sitting and/or standing);Decreased cognition   OT Treatment/Interventions: Self-care/ADL training;DME and/or AE instruction;Balance training;Patient/family education    OT Goals(Current goals can be found in the care plan section) Acute Rehab OT Goals Patient Stated Goal: Hopes to be able to get better and go home OT Goal Formulation: With patient Time For Goal Achievement: 06/03/15 Potential to Achieve Goals: Good ADL Goals Pt Will Perform Grooming: with set-up;standing Pt Will Perform Lower Body Bathing: with set-up;sit to/from stand Pt Will Perform Lower Body Dressing: with set-up;sit to/from stand Pt Will Transfer to Toilet: with set-up;ambulating;regular height toilet Pt Will Perform Toileting - Clothing Manipulation and hygiene: with set-up;sit to/from stand  OT Frequency: Min 2X/week   Barriers to D/C:            Co-evaluation              End of Session Equipment Utilized During Treatment: Surveyor, mining Communication: Mobility status  Activity Tolerance: Patient tolerated treatment well Patient left: in chair;with call bell/phone within reach;with chair alarm set   Time: 1110-1140 OT Time Calculation (min): 30 min Charges:  OT General Charges $OT Visit: 1 Procedure OT Evaluation $Initial OT Evaluation Tier I: 1 Procedure OT Treatments $Self Care/Home Management : 8-22 mins G-Codes:    Malka So 05/20/2015, 1:06 PM  [phone]

## 2015-05-20 NOTE — Progress Notes (Signed)
Patient stated she did not sleep well last night and would like something to help her sleep. Also, her family member at the bedside states she has a history of c.diff and usually takes a probiotic while at home. Kalman Shan, med student, notified of this. Awaiting new orders.  Joellen Jersey, RN.

## 2015-05-20 NOTE — Clinical Social Work Note (Signed)
Clinical Social Work Assessment  Patient Details  Name: Kelly Porter MRN: [id number] Date of Birth: 11/12/1918  Date of referral:  05/20/15               Reason for consult:  Facility Placement                Permission sought to share information with:  Family Supports Permission granted to share information::  Yes, Verbal Permission Granted, Yes, Release of Information Signed  Name::     Fonnie Birkenhead  Agency::     Relationship::  Niece  Contact Information:  [phone]) and [phone])  Housing/Transportation Living arrangements for the past 2 months:  [place] of Information:  Patient, Other (Comment Required) (Family) Patient Interpreter Needed:  None Criminal Activity/Legal Involvement Pertinent to Current Situation/Hospitalization:  No - Comment as needed Significant Relationships:  Other Family Members, Neighbor Lives with:  Self Do you feel safe going back to the place where you live?  Yes Need for family participation in patient care:  Yes (Comment)  Care giving concerns:  None expressed by patient or niece Fonnie Birkenhead.   Social Worker assessment / plan:  CSW talked initially with patient and then later with patient and her niece Fonnie Birkenhead at the bedside regarding discharge planning and recommendation of [facility] rehab. At initial visit patient was sitting in the chair at bedside and was alert, very pleasant and open to speaking with CSW. Patient reported that she has lots support from family and 2 neighbors. Mrs. Salomone stated that her niece Romie Minus "runs her life" - handles all of her affairs. She added that her nephew Lavell Luster and his wife Romie Minus live nearby as does her brother Leane Para and her 2 neighbors Maryann Alar and Joylene Draft who help her out. After further conversation, patient agreeable to [facility] re[facility] and informed CSW that she has been to [facility] facility before for several months. Her niece Bethena Roys confirmed this and reported that she has  made contact with administration at [facility] regarding patient needing rehab.  Employment status:  Retired Forensic scientist:    PT Recommendations:    Information / Referral to community resources:  Other (Comment Required) (None needed or requested at this time.)  Patient/Family's Response to care:  Patient or niece expressed no concerns regarding her care at the hospital.  Patient/Family's Understanding of and Emotional Response to Diagnosis, Current Treatment, and Prognosis:  Not discussed.  Emotional Assessment Appearance:  Appears stated age Attitude/Demeanor/Rapport:  Other (Appropriate) Affect (typically observed):  Appropriate Orientation:  Oriented to Self, Oriented to Place, Oriented to  Time, Oriented to Situation Alcohol / Substance use:  Never Used (Patient reported that she has never smoked and does not drink or use illicit drugs.) Psych involvement (Current and /or in the community):  No (Comment)  Discharge Needs  Concerns to be addressed:  Discharge Planning Concerns Readmission within the last 30 days:  No Current discharge risk:  None Barriers to Discharge:  No Barriers Identified   Sable Feil, LCSW 05/20/2015, 4:25 PM

## 2015-05-20 NOTE — Progress Notes (Addendum)
Subjective: Ms. Kelly Porter was seen and examined this AM.  She is doing well but does have some back pain.    Objective: Vital signs in last 24 hours: Filed Vitals:   05/19/15 2340 05/20/15 0141 05/20/15 0539 05/20/15 0805  BP: 150/67 143/67 140/66 155/73  Pulse: 90 81 78 79  Temp: 98.2 F (36.8 C) 97.5 F (36.4 C) 98 F (36.7 C) 98.6 F (37 C)  TempSrc: Oral Oral Oral Oral  Resp: 18 19 17 17   Height:      Weight:      SpO2: 100% 98% 98% 99%   Weight change:   Intake/Output Summary (Last 24 hours) at 05/20/15 1303 Last data filed at 05/20/15 1255  Gross per 24 hour  Intake 1988.25 ml  Output    650 ml  Net 1338.25 ml   General: resting in bed in NAD HEENT: [place]/AT Cardiac: RRR, no rubs, murmurs or gallops Pulm: clear to auscultation bilaterally, moving normal volumes of air Abd: soft, nontender, nondistended, BS present Ext: warm and well perfused, no pedal edema Neuro: alert and oriented X3, responding appropriately  Lab Results: Basic Metabolic Panel:  Recent Labs Lab 05/19/15 1839 05/20/15 0556  NA 129* 132*  K 4.8 4.0  CL 99* 102  CO2 24 22  GLUCOSE 134* 91  BUN 11 12  CREATININE 1.06* 0.94  CALCIUM 7.7* 7.8*   Liver Function Tests:  Recent Labs Lab 05/19/15 1839  AST 18  ALT 11*  ALKPHOS 57  BILITOT 0.3  PROT 4.6*  ALBUMIN 2.7*   CBC:  Recent Labs Lab 05/19/15 1839 05/20/15 0556  WBC 6.3 6.7  NEUTROABS 5.6  --   HGB 6.4* 8.2*  HCT 20.3* 25.3*  MCV 83.9 85.2  PLT 253 262   CBG:  Recent Labs Lab 05/19/15 1840  GLUCAP 141*   Coagulation:  Recent Labs Lab 05/20/15 0556  LABPROT 14.3  INR 1.09   Anemia Panel:  Recent Labs Lab 05/19/15 2253  FERRITIN 14  TIBC 330  IRON 9*  RETICCTPCT 1.3   Urinalysis:  Recent Labs Lab 05/19/15 1856  COLORURINE YELLOW  LABSPEC 1.019  PHURINE 7.0  GLUCOSEU NEGATIVE  HGBUR NEGATIVE  BILIRUBINUR NEGATIVE  KETONESUR NEGATIVE  PROTEINUR NEGATIVE  UROBILINOGEN 0.2  NITRITE  NEGATIVE  LEUKOCYTESUR NEGATIVE   Studies/Results: Ct Head Wo Contrast  05/19/2015   CLINICAL DATA:  Patient found at home unresponsive on floor, constricted pupils, skin tear is on right elbow and left forearm suggesting followup, on antibiotics for urinary tract infection  EXAM: CT HEAD WITHOUT CONTRAST  CT CERVICAL SPINE WITHOUT CONTRAST  TECHNIQUE: Multidetector CT imaging of the head and cervical spine was performed following the standard protocol without intravenous contrast. Multiplanar CT image reconstructions of the cervical spine were also generated.  COMPARISON:  01/12/2004  FINDINGS: CT HEAD FINDINGS  Severe atrophy. Moderate low attenuation in the deep white matter. No focal findings to suggest mass or transcortical infarct. No hemorrhage or extra-axial fluid. No hydrocephalus. No skull fracture.  CT CERVICAL SPINE FINDINGS  No cervical spine fracture. No acute soft tissue abnormalities. Carotid artery calcification on the right. Thyroid normal. Lung apices clear. Normal alignment. Multilevel degenerative disc disease.  IMPRESSION: No acute intracranial abnormalities.  No cervical spine fracture.   Electronically Signed   By: Skipper Cliche M.D.   On: 05/19/2015 19:41   Ct Cervical Spine Wo Contrast  05/19/2015   CLINICAL DATA:  Patient found at home unresponsive on floor, constricted pupils, skin  tear is on right elbow and left forearm suggesting followup, on antibiotics for urinary tract infection  EXAM: CT HEAD WITHOUT CONTRAST  CT CERVICAL SPINE WITHOUT CONTRAST  TECHNIQUE: Multidetector CT imaging of the head and cervical spine was performed following the standard protocol without intravenous contrast. Multiplanar CT image reconstructions of the cervical spine were also generated.  COMPARISON:  01/12/2004  FINDINGS: CT HEAD FINDINGS  Severe atrophy. Moderate low attenuation in the deep white matter. No focal findings to suggest mass or transcortical infarct. No hemorrhage or extra-axial  fluid. No hydrocephalus. No skull fracture.  CT CERVICAL SPINE FINDINGS  No cervical spine fracture. No acute soft tissue abnormalities. Carotid artery calcification on the right. Thyroid normal. Lung apices clear. Normal alignment. Multilevel degenerative disc disease.  IMPRESSION: No acute intracranial abnormalities.  No cervical spine fracture.   Electronically Signed   By: Skipper Cliche M.D.   On: 05/19/2015 19:41   Medications: I have reviewed the patient's current medications. Scheduled Meds: . antiseptic oral rinse  7 mL Mouth Rinse BID  . sodium chloride  3 mL Intravenous Q12H   Continuous Infusions: . sodium chloride 75 mL/hr at 05/19/15 2215   PRN Meds:.acetaminophen **OR** acetaminophen, traMADol Assessment/Plan: 81 year old with hx of Fe-deficiency anemia presented after syncopal episode with Hgb 6.4.  Anemia 2/2 GI blood loss:  FOBT positive.  Baseline Hgb 9 per family.  Patient is on Fe and anemia panel here is c/w Fe deficiency.  S/p 1 unit Hgb improved to 8.2.  Had discussion with patient's niece (at her request) to verify Kelly Porter given her age.  Niece says preference is for conservative care so will not get GI consult. - IV ferahema today, continue po supplement at d/c to keep hgb near baseline - add protonix - follow-up with PCP  Syncope:  Likely symptomatic anemia.   - treating anemia as above - continue telemetry  Dispo: Pt eval completed and recs for SNF/24 hour supervision.  Will discuss this with patient and niece tomorrow AM.    The patient does have a current PCP Josetta Huddle, MD) and does not need an [facility] hospital follow-up appointment after discharge.  The patient does not know have transportation limitations that hinder transportation to clinic appointments.  .Services Needed at time of discharge: Y = Yes, Blank = No PT:   OT:   RN:   Equipment:   Other:     LOS: 1 day   [name], DO 05/20/2015, 1:03 PM

## 2015-05-21 LAB — CBC
HEMATOCRIT: 29.8 % — AB (ref 36.0–46.0)
HEMOGLOBIN: 9.4 g/dL — AB (ref 12.0–15.0)
MCH: 27.2 pg (ref 26.0–34.0)
MCHC: 31.5 g/dL (ref 30.0–36.0)
MCV: 86.1 fL (ref 78.0–100.0)
Platelets: 314 10*3/uL (ref 150–400)
RBC: 3.46 MIL/uL — AB (ref 3.87–5.11)
RDW: 14.4 % (ref 11.5–15.5)
WBC: 7 10*3/uL (ref 4.0–10.5)

## 2015-05-21 MED ORDER — CYCLOBENZAPRINE HCL 5 MG PO TABS: 5.0000 mg | ORAL_TABLET | Freq: Three times a day (TID) | ORAL | Status: DC | PRN

## 2015-05-21 MED ORDER — PANTOPRAZOLE SODIUM 20 MG PO TBEC
20.0000 mg | DELAYED_RELEASE_TABLET | Freq: Two times a day (BID) | ORAL | Status: DC
  Administered 2015-05-21 – 2015-05-22 (×2): 20 mg via ORAL
  Filled 2015-05-21 (×3): qty 1

## 2015-05-21 MED ORDER — BACID PO TABS
2.0000 | ORAL_TABLET | Freq: Three times a day (TID) | ORAL | Status: DC
  Administered 2015-05-21: 2 via ORAL
  Filled 2015-05-21 (×5): qty 2

## 2015-05-21 NOTE — Progress Notes (Signed)
Internal Medicine Attending  Date: 05/21/2015  Patient name: Kelly Porter Medical record number: [id number] Date of birth: 08-07-1919 Age: 81 y.o. Gender: female  I saw and evaluated the patient. I reviewed the resident's note by Dr. Redmond Pulling and I agree with the resident's findings and plans as documented in her progress note.  [name] was feeling well when seen on rounds this morning. She had a dark bowel movement that was unchanged from her usual dark bowel movements while taking the iron. Her back pain is improved. She has responded nicely to the one unit of packed red blood cells and also received IV iron. We will calculate her iron needs and give her another dose tomorrow should we need to in order to meet her calculated needs. She will be transferred to the skilled nursing facility for further rehabilitation and gait training tomorrow.

## 2015-05-21 NOTE — Progress Notes (Addendum)
Subjective: Kelly Porter was seen and examined this AM.  She is feeling well, no further bleeding, no back pain at present.  She would like to eat a regular diet (she had been on liquids).  Objective: Vital signs in last 24 hours: Filed Vitals:   05/20/15 0805 05/20/15 1643 05/20/15 2203 05/21/15 0512  BP: 155/73 159/72 155/74 155/80  Pulse: 79 79 85 79  Temp: 98.6 F (37 C) 98.2 F (36.8 C) 98.6 F (37 C) 98.2 F (36.8 C)  TempSrc: Oral Oral Oral Oral  Resp: 17 17 16 17   Height:      Weight:   102 lb 1.2 oz (46.3 kg)   SpO2: 99% 97% 99% 96%   Weight change: 6 lb 1.2 oz (2.755 kg)  Intake/Output Summary (Last 24 hours) at 05/21/15 0731 Last data filed at 05/21/15 5625  Gross per 24 hour  Intake   2782 ml  Output   3050 ml  Net   -268 ml   General: resting in bed in NAD HEENT: [place]/AT Cardiac: RRR, no rubs, murmurs or gallops Pulm: clear to auscultation bilaterally, moving normal volumes of air Abd: soft, nontender, nondistended, BS present Ext: warm and well perfused, no pedal edema Neuro: alert and oriented X3, responding appropriately, MAE spontaneously  Lab Results:  CBC:  Recent Labs Lab 05/19/15 1839 05/20/15 0556 05/21/15 0900  WBC 6.3 6.7 7.0  NEUTROABS 5.6  --   --   HGB 6.4* 8.2* 9.4*  HCT 20.3* 25.3* 29.8*  MCV 83.9 85.2 86.1  PLT 253 262 314    Medications: I have reviewed the patient's current medications. Scheduled Meds: . antiseptic oral rinse  7 mL Mouth Rinse BID  . pantoprazole (PROTONIX) IV  40 mg Intravenous Q12H  . sodium chloride  3 mL Intravenous Q12H   Continuous Infusions:  none   PRN Meds:.acetaminophen **OR** acetaminophen, traMADol   Assessment/Plan: 81 year old with hx of Fe-deficiency anemia presented after syncopal episode with Hgb 6.4 and FOBT +.  Anemia 2/2 GI blood loss:  Likely due to excessive NSAID use for low back pain.  Hgb 6s --> 9.4 this AM s/p 1 unit PRBC and 510mg  feraheme.  She is asymptomatic and no blood  loss noted. - patient advised to d/c NSAIDs - continue PPI at d/c - continue po Fe supplement at d/c - she can start a regular diet - d/c to SNF once bed available  Syncope:  Likely symptomatic anemia and/or consequence of Zanaflex (hypotension). - treating anemia as above - d/c Zanaflex and start trial of Flexeril 5mg  TID prn (both patient and family cautioned that this med can still be sedating but less BP effect than Zanaflex) - continue telemetry  Low back pain:  Lumbar paraspinal musculature was TTP yesterday.  Xray c/w OA.  She feels better today.   - patient advised to STOP NSAIDs as above - she tolerated a few doses of Tramadol inpatient; would rec Tylenol prn going forward but could try Tramadol as outpatient if pain not controlled with Tylenol and conservative measures  - Zanaflex --> Flexeril  - continue PT inpatient and d/c to SNF for rehab  Dispo: Pt is feeling well, back pain is better, no further blood loss.  She is stable for d/c to SNF once a bed is available.   The patient does have a current PCP Josetta Huddle, MD) and does not need an [facility] hospital follow-up appointment after discharge.  The patient does not know have transportation  limitations that hinder transportation to clinic appointments.  .Services Needed at time of discharge: Y = Yes, Blank = No PT:   OT:   RN:   Equipment:   Other:     LOS: 2 days   [name], DO 05/21/2015, 7:31 AM

## 2015-05-21 NOTE — Care Management Important Message (Signed)
Important Message  Patient Details  Name: Kelly Porter MRN: [id number] Date of Birth: 07-29-1919   Medicare Important Message Given:  Yes-second notification given    Delorse Lek 05/21/2015, 3:16 PM

## 2015-05-21 NOTE — Clinical Social Work Note (Signed)
Family's preference is [facility] in [place]. Call made to admissions director Myra Gianotti regarding patient coming to facility for ST rehab. They will be able to accept patient Thursday, 8/4 as patient will have a 3-day qualifying stay. CSW talked with patient's niece regarding discharge and she will transport patient on 8/4.  Jamita Mckelvin Givens, MSW, LCSW Licensed Clinical Social Worker [place] [phone]

## 2015-05-21 NOTE — Progress Notes (Signed)
Subjective: Kelly Porter was seen and examined this AM.  She denies back pain at this time.  She stated she had a restless sleep due to the machine alarms in the room.  She had her first bowel movement in 3 days which she described as black stools.  She was given Feraheme yesterday and reported no complications from the infusion.    Objective: Vital signs in last 24 hours: Filed Vitals:   05/20/15 1643 05/20/15 2203 05/21/15 0512 05/21/15 0926  BP: 159/72 155/74 155/80 146/77  Pulse: 79 85 79 81  Temp: 98.2 F (36.8 C) 98.6 F (37 C) 98.2 F (36.8 C) 98.1 F (36.7 C)  TempSrc: Oral Oral Oral Oral  Resp: 17 16 17 18   Height:      Weight:  46.3 kg (102 lb 1.2 oz)    SpO2: 97% 99% 96% 98%   Weight change: 2.755 kg (6 lb 1.2 oz)  Intake/Output Summary (Last 24 hours) at 05/21/15 1530 Last data filed at 05/21/15 1233  Gross per 24 hour  Intake   1825 ml  Output   3000 ml  Net  -1175 ml   General: sitting up and resting in bed HEENT: PERRL, EOMI, no scleral icterus Cardiac: RRR, no rubs, murmurs or gallops Pulm: clear to auscultation bilaterally, moving normal volumes of air Abd: soft, nontender, nondistended, BS present Ext: warm and well perfused, no pedal edema Neuro: alert and oriented X3  Lab Results: @LABTEST2 @ Micro Results: No results found for this or any previous visit (from the past 240 hour(s)). Studies/Results: Dg Lumbar Spine 2-3 Views  05/20/2015   CLINICAL DATA:  Lower back pain after a fall yesterday.  EXAM: LUMBAR SPINE - 2-3 VIEW  COMPARISON:  07/31/2010  FINDINGS: There is no evidence for acute fracture or dislocation. Moderate disc height loss and osteophyte formation at L1-2. Previous left hip arthroplasty.  Regional bowel gas pattern is nonobstructive. Moderate stool burden.  IMPRESSION: 1. Moderate degenerative changes. 2.  No evidence for acute  abnormality.   Electronically Signed   By: Nolon Nations M.D.   On: 05/20/2015 14:25   Ct Head Wo  Contrast  05/19/2015   CLINICAL DATA:  Patient found at home unresponsive on floor, constricted pupils, skin tear is on right elbow and left forearm suggesting followup, on antibiotics for urinary tract infection  EXAM: CT HEAD WITHOUT CONTRAST  CT CERVICAL SPINE WITHOUT CONTRAST  TECHNIQUE: Multidetector CT imaging of the head and cervical spine was performed following the standard protocol without intravenous contrast. Multiplanar CT image reconstructions of the cervical spine were also generated.  COMPARISON:  01/12/2004  FINDINGS: CT HEAD FINDINGS  Severe atrophy. Moderate low attenuation in the deep white matter. No focal findings to suggest mass or transcortical infarct. No hemorrhage or extra-axial fluid. No hydrocephalus. No skull fracture.  CT CERVICAL SPINE FINDINGS  No cervical spine fracture. No acute soft tissue abnormalities. Carotid artery calcification on the right. Thyroid normal. Lung apices clear. Normal alignment. Multilevel degenerative disc disease.  IMPRESSION: No acute intracranial abnormalities.  No cervical spine fracture.   Electronically Signed   By: Skipper Cliche M.D.   On: 05/19/2015 19:41   Ct Cervical Spine Wo Contrast  05/19/2015   CLINICAL DATA:  Patient found at home unresponsive on floor, constricted pupils, skin tear is on right elbow and left forearm suggesting followup, on antibiotics for urinary tract infection  EXAM: CT HEAD WITHOUT CONTRAST  CT CERVICAL SPINE WITHOUT CONTRAST  TECHNIQUE: Multidetector CT imaging  of the head and cervical spine was performed following the standard protocol without intravenous contrast. Multiplanar CT image reconstructions of the cervical spine were also generated.  COMPARISON:  01/12/2004  FINDINGS: CT HEAD FINDINGS  Severe atrophy. Moderate low attenuation in the deep white matter. No focal findings to suggest mass or transcortical infarct. No hemorrhage or extra-axial fluid. No hydrocephalus. No skull fracture.  CT CERVICAL SPINE  FINDINGS  No cervical spine fracture. No acute soft tissue abnormalities. Carotid artery calcification on the right. Thyroid normal. Lung apices clear. Normal alignment. Multilevel degenerative disc disease.  IMPRESSION: No acute intracranial abnormalities.  No cervical spine fracture.   Electronically Signed   By: Skipper Cliche M.D.   On: 05/19/2015 19:41   Medications: I have reviewed the patient's current medications. Scheduled Meds: . antiseptic oral rinse  7 mL Mouth Rinse BID  . pantoprazole  20 mg Oral BID  . sodium chloride  3 mL Intravenous Q12H   Continuous Infusions:  PRN Meds:.acetaminophen **OR** acetaminophen, cyclobenzaprine  Assessment/Plan: 81 year old female with a pmhx of diverticulosis and mild dementia presented after a syncopal episode after taking Zanaflex but also found to have a Hgb of 6.4 Active problems   Syncope : Syncopal episode after Zanaflex use.  This has been discontinued.  Initially concerned for orthostatic hypotension due to low BP on admission.  This has been resolved with fluid resuscitation. -  BP stable, correcting anemia as it may be a contributing factor - Avoid Zanaflex    Back pain : acute on chronic back pain.  X-rays showed mild degenerate changes with no acute fractures or abnormalities.  Back pain likely due to muscle spasm.  - Flexeril 5mg  (avoiding Zanaflex) and Tylenol 650mg  PRN (avoiding narcotics and NSAIDS)    Iron deficiency anemia due to chronic GI bleeding : Anemia panel consistent with IDA.  Family does not want aggressive work up.  Will treat conservatively    - patient was given Feraheme on 8/2, Hb is currently 9.4.  Can continue Oral Iron as outpatient. -  Protonix 20mg  BID  Code status - DNR Diet - Regular DVT - SCDs (High risk bleed)  This is a Careers information officer Note.  The care of the patient was discussed with Dr. Redmond Pulling and the assessment and plan formulated with their assistance.  Please see their attached note for  official documentation of the daily encounter.   LOS: 2 days   Valinda Party, Med Student 05/21/2015, 3:30 PM

## 2015-05-22 LAB — CBC
HCT: 29.8 % — ABNORMAL LOW (ref 36.0–46.0)
HEMOGLOBIN: 9.4 g/dL — AB (ref 12.0–15.0)
MCH: 26.9 pg (ref 26.0–34.0)
MCHC: 31.5 g/dL (ref 30.0–36.0)
MCV: 85.1 fL (ref 78.0–100.0)
PLATELETS: 307 10*3/uL (ref 150–400)
RBC: 3.5 MIL/uL — ABNORMAL LOW (ref 3.87–5.11)
RDW: 14.5 % (ref 11.5–15.5)
WBC: 4.9 10*3/uL (ref 4.0–10.5)

## 2015-05-22 MED ORDER — CYCLOBENZAPRINE HCL 5 MG PO TABS: 5.0000 mg | ORAL_TABLET | Freq: Three times a day (TID) | ORAL | Status: DC | PRN

## 2015-05-22 MED ORDER — PANTOPRAZOLE SODIUM 40 MG PO TBEC: 40.0000 mg | DELAYED_RELEASE_TABLET | Freq: Every day | ORAL | Status: AC

## 2015-05-22 NOTE — Discharge Instructions (Addendum)
1. Please STOP taking NSAIDS.  NSAID drugs include ibuprofen (Advil), naproxen (Aleve), meloxicam (Mobic), etc.  Taking large amounts of these drugs can irritate your stomach and lead to bleeding.  You can take Tylenol as needed (but no more than recommended on the bottle) for back pain.  STOP taking Zanaflex since it may have contributed to your loss or consciousness.  I have prescribed you Flexeril to take up to three times per day if needed.   2. Please take all medications as prescribed.    3. If you have worsening of your symptoms or new symptoms arise, please call the clinic ([phone]), or go to the ER immediately if symptoms are severe.   Please follow-up with Dr. Inda Merlin after you are discharged from the skilled nursing facility.

## 2015-05-22 NOTE — Progress Notes (Signed)
Internal Medicine Attending  Date: 05/22/2015  Patient name: Kelly Porter Medical record number: [id number] Date of birth: December 09, 1918 Age: 81 y.o. Gender: female  I saw and evaluated the patient. I reviewed the resident's note by Dr. Redmond Pulling I agree with the resident's findings and plans as documented in her progress note.  When seen on rounds this morning Ms. Larocca was feeling well with no complaints of back pain while on the as needed Flexeril and Tylenol. Clinically, she had no signs or symptoms suggestive of continuing GI bleeding. She felt stable for discharge to a nursing home to work on short-term rehabilitation with gait training.

## 2015-05-22 NOTE — Discharge Summary (Signed)
Patient Name:  Kelly Porter  MRN: [id number]  PCP: Henrine Screws, MD  DOB:  1919/05/18       Date of Admission:  05/19/2015  Date of Discharge:  05/22/2015      Attending Physician: Dr. Oval Linsey, MD         DISCHARGE DIAGNOSES: 1. Iron deficiency anemia secondary to GI blood loss 2. Syncope 3. Low back pain  DISPOSITION AND FOLLOW-UP: Kelly Porter is to follow-up with the listed providers as detailed below, at patient's visiting, please address following issues:  1. Has she stopped NSAIDs?  Is she taking Fe supplement and PPI?  Is her back pain controlled?  Any more syncopal episodes?  2. Labs / imaging needed at time of follow-up: CBC  3. Pending labs/ test needing follow-up: none   Follow-up Information    Follow up with GATES,ROBERT NEVILL, MD In 1 week.   Specialty:  Internal Medicine   Why:  for hospital follow-up   Contact information:   [address] [place] [address] [phone]      Discharge Instructions    Call MD for:  difficulty breathing, headache or visual disturbances    Complete by:  As directed      Call MD for:  extreme fatigue    Complete by:  As directed      Call MD for:  hives    Complete by:  As directed      Call MD for:  persistant dizziness or light-headedness    Complete by:  As directed      Call MD for:  persistant nausea and vomiting    Complete by:  As directed      Call MD for:  redness, tenderness, or signs of infection (pain, swelling, redness, odor or green/yellow discharge around incision site)    Complete by:  As directed      Call MD for:  severe uncontrolled pain    Complete by:  As directed      Call MD for:  temperature >100.4    Complete by:  As directed      Diet - low sodium heart healthy    Complete by:  As directed      Increase activity slowly    Complete by:  As directed             DISCHARGE MEDICATIONS:   Medication List    STOP taking these medications        ibuprofen 200 MG tablet  Commonly known as:  ADVIL,MOTRIN     tiZANidine 4 MG tablet  Commonly known as:  ZANAFLEX      TAKE these medications        CALCIUM PO  Take 1 tablet by mouth daily.     cyclobenzaprine 5 MG tablet  Commonly known as:  FLEXERIL  Take 1 tablet (5 mg total) by mouth 3 (three) times daily as needed for muscle spasms.     IRON PO  Take 1 tablet by mouth daily.     MULTIVITAMIN ADULT PO  Take 1 tablet by mouth daily.     pantoprazole 40 MG tablet  Commonly known as:  PROTONIX  Take 1 tablet (40 mg total) by mouth daily.     polyethylene glycol packet  Commonly known as:  MIRALAX / GLYCOLAX  Take 17 g by mouth daily as needed for moderate constipation.         CONSULTS:  none    PROCEDURES PERFORMED:  Dg Lumbar Spine 2-3 Views  05/20/2015   CLINICAL DATA:  Lower back pain after a fall yesterday.  EXAM: LUMBAR SPINE - 2-3 VIEW  COMPARISON:  07/31/2010  FINDINGS: There is no evidence for acute fracture or dislocation. Moderate disc height loss and osteophyte formation at L1-2. Previous left hip arthroplasty.  Regional bowel gas pattern is nonobstructive. Moderate stool burden.  IMPRESSION: 1. Moderate degenerative changes. 2.  No evidence for acute  abnormality.   Electronically Signed   By: Nolon Nations M.D.   On: 05/20/2015 14:25   Ct Head Wo Contrast  05/19/2015   CLINICAL DATA:  Patient found at home unresponsive on floor, constricted pupils, skin tear is on right elbow and left forearm suggesting followup, on antibiotics for urinary tract infection  EXAM: CT HEAD WITHOUT CONTRAST  CT CERVICAL SPINE WITHOUT CONTRAST  TECHNIQUE: Multidetector CT imaging of the head and cervical spine was performed following the standard protocol without intravenous contrast. Multiplanar CT image reconstructions of the cervical spine were also generated.  COMPARISON:  01/12/2004  FINDINGS: CT HEAD FINDINGS  Severe atrophy. Moderate low attenuation in the deep white  matter. No focal findings to suggest mass or transcortical infarct. No hemorrhage or extra-axial fluid. No hydrocephalus. No skull fracture.  CT CERVICAL SPINE FINDINGS  No cervical spine fracture. No acute soft tissue abnormalities. Carotid artery calcification on the right. Thyroid normal. Lung apices clear. Normal alignment. Multilevel degenerative disc disease.  IMPRESSION: No acute intracranial abnormalities.  No cervical spine fracture.   Electronically Signed   By: Skipper Cliche M.D.   On: 05/19/2015 19:41   Ct Cervical Spine Wo Contrast  05/19/2015   CLINICAL DATA:  Patient found at home unresponsive on floor, constricted pupils, skin tear is on right elbow and left forearm suggesting followup, on antibiotics for urinary tract infection  EXAM: CT HEAD WITHOUT CONTRAST  CT CERVICAL SPINE WITHOUT CONTRAST  TECHNIQUE: Multidetector CT imaging of the head and cervical spine was performed following the standard protocol without intravenous contrast. Multiplanar CT image reconstructions of the cervical spine were also generated.  COMPARISON:  01/12/2004  FINDINGS: CT HEAD FINDINGS  Severe atrophy. Moderate low attenuation in the deep white matter. No focal findings to suggest mass or transcortical infarct. No hemorrhage or extra-axial fluid. No hydrocephalus. No skull fracture.  CT CERVICAL SPINE FINDINGS  No cervical spine fracture. No acute soft tissue abnormalities. Carotid artery calcification on the right. Thyroid normal. Lung apices clear. Normal alignment. Multilevel degenerative disc disease.  IMPRESSION: No acute intracranial abnormalities.  No cervical spine fracture.   Electronically Signed   By: Skipper Cliche M.D.   On: 05/19/2015 19:41       ADMISSION DATA: Admission HPI: Kelly Porter is a 81 y.o. female with a past medical history of dementia, diverticulosis who presents to the emergency department with syncope. HPI is provided by both the patient and the patients niece who was  present with Ms. Cozad. Per patients niece, patient has been increasingly weak, fatigued, and confused for approximately the past month. Reports that she has also had a couple of falls during this time associated with dizziness and being light-headed. Also reports last Saturday 7/23 an episode of watery, non-bloody diarrhea. She was subsequently seen at an urgent care the following day where she had a hemoglobin of at least 9. Today, on admission her hemoglobin is 6.4, hematocrit of 20.3. Pt reports black stools recently but does not  recall any bright red blood per rectum and no hematemesis. To coincide with her increased fatigue and confusion, patient also complains of increased back pain for two weeks localized to the thoracolumbar area of her spine. For this she was recently seen by her physician and given prescription for Zanaflex. Today, patient took her Zanaflex as prescribed and was unattended for a period of 45-60 minutes at which point she was found on the ground by relatives unable to get up. During the exam patient was alert and oriented to person only.   HOSPITAL COURSE:  Iron deficiency anemia due to chronic blood loss - On admission patient had a Hb of 6.4 and FOBT positive.  Fe deficit based on goal Hgb of ~ 10 is about 350mg .  She has known history of Fe deficiency anemia and reports compliance with therapy.  Current worsening likley due to excessive NSAID use.  She was transfused 1 unit PRBCs and was given 510mg  IV Feraheme and hemoglobin stabilized at 9.4.   Discussed possible GI consult with family, however due to her advanced age and long term treatment goals family member declined further workup and would like more conservative care.Therefore, the focus was on prevention of further blood loss and symptom control.  Patient was counseled to avoid NSAIDs.  She was started on Protonix to be continued at discharge.  She was evaluated by PT with recommendations for SNF placement for  rehab.  Would recommend follow-up CBC while at SNF to ensure Hgb is stable.   She will follow-up with her PCP, Dr. Inda Merlin.  Syncope - She presented to ED because of a syncopal episode. On admission CT head Wo contrast showed no acute intracranial abnormalities and EKG showed normal sinus rhythm at 64 bpm. Given the temporality of taking Zanaflex, her syncopal episode was likely related to the medication and it was discontinued. However, with hgb 6.4 and BP of 94/53 on admission GI bleed may have contributed to the syncopal episode. EKG unremarkable and no telemetry events to suggest cardiac etiology.   Acute on Chronic Back pain - Patient complained of worsening back pain in the past 2 weeks. This has lead her to increase her NSAID and Zanaflex use. Xray Lumbar Spine showed no evidence of acute fracture or dislocation. There is mild degenerative changes and evidence of a left hip arthroplasty. Back pain likely due to OA and/or muscle spasm. Pain was controlled during admission and she was started on Flexeril TID prn which is less likely to affect blood pressure.  She was instructed to stop all NSAIDs given concern for GI blood loss.  She was advised that Tylenol would be an appropriate alternative.  Other conservative measures would include heat packs, topical pain relievers, PT.   DISCHARGE DATA: Vital Signs: BP 155/80 mmHg  Pulse 87  Temp(Src) 98.1 F (36.7 C) (Oral)  Resp 18  Ht 5\' 3"  (1.6 m)  Wt 101 lb (45.813 kg)  BMI 17.90 kg/m2  SpO2 99%  Labs: Results for orders placed or performed during the hospital encounter of 05/19/15 (from the past 24 hour(s))  CBC     Status: Abnormal   Collection Time: 05/22/15  4:05 AM  Result Value Ref Range   WBC 4.9 4.0 - 10.5 K/uL   RBC 3.50 (L) 3.87 - 5.11 MIL/uL   Hemoglobin 9.4 (L) 12.0 - 15.0 g/dL   HCT 29.8 (L) 36.0 - 46.0 %   MCV 85.1 78.0 - 100.0 fL   MCH 26.9 26.0 - 34.0 pg  MCHC 31.5 30.0 - 36.0 g/dL   RDW 14.5 11.5 - 15.5 %     Platelets 307 150 - 400 K/uL     Services Ordered on Discharge: Y = Yes; Blank = No PT:   OT:   RN:   Equipment:   Other:      Time Spent on Discharge: 35 min   Signed: Duwaine Maxin DO PGY 3, Internal Medicine Resident 05/22/2015, 1:43 PM

## 2015-05-22 NOTE — Clinical Social Work Placement (Signed)
   CLINICAL SOCIAL WORK PLACEMENT  NOTE  Date:  05/22/2015  Patient Details  Name: Kelly Porter MRN: [id number] Date of Birth: 02-13-1919  Clinical Social Work is seeking post-discharge placement for this patient at the [place] level of care (*CSW will initial, date and re-position this form in  chart as items are completed):  Yes   Patient/family provided with [facility] Work Department's list of facilities offering this level of care within the geographic area requested by the patient (or if unable, by the patient's family).  Yes   Patient/family informed of their freedom to choose among providers that offer the needed level of care, that participate in Medicare, Medicaid or managed care program needed by the patient, have an available bed and are willing to accept the patient.  Yes   Patient/family informed of [facility]'s ownership interest in [facility] and [facility], as well as of the fact that they are under no obligation to receive care at these facilities.  PASRR submitted to EDS on 05/21/15     PASRR number received on 05/21/15     Existing PASRR number confirmed on       FL2 transmitted to all facilities in geographic area requested by pt/family on 05/21/15     FL2 transmitted to all facilities within larger geographic area on       Patient informed that his/her managed care company has contracts with or will negotiate with certain facilities, including the following:        Yes   Patient/family informed of bed offers received.  Patient chooses bed at [facility], [place]     Physician recommends and patient chooses bed at      Patient to be transferred to [facility] on 05/22/15.  Patient to be transferred to facility by Family     Patient family notified on 05/22/15 of transfer.  Name of family member notified:  Fonnie Birkenhead, niece     PHYSICIAN       Additional Comment:     _______________________________________________ Sable Feil, LCSW 05/22/2015, 11:45 AM

## 2015-05-22 NOTE — Progress Notes (Signed)
Subjective: Kelly Porter was seen and examined this AM.  She is feeling well and feels ready for d/c to SNF.  Objective: Vital signs in last 24 hours: Filed Vitals:   05/21/15 1604 05/21/15 1957 05/22/15 0409 05/22/15 0834  BP: 184/81 131/64 144/69 155/80  Pulse: 78 75 79 87  Temp: 98.8 F (37.1 C) 98.5 F (36.9 C) 98.4 F (36.9 C) 98.1 F (36.7 C)  TempSrc: Oral Oral Oral Oral  Resp: 18 17 18 18   Height:      Weight:  101 lb (45.813 kg)    SpO2: 100% 98% 96% 99%   Weight change: -1 lb 1.2 oz (-0.487 kg)  Intake/Output Summary (Last 24 hours) at 05/22/15 1219 Last data filed at 05/22/15 0948  Gross per 24 hour  Intake    720 ml  Output    300 ml  Net    420 ml   General: sitting up in chair (on telephone initially), she is in the process of eating breakfast and is also watching tv HEENT: [place]/AT Cardiac: RRR, no rubs, murmurs or gallops Pulm: clear to auscultation bilaterally, moving normal volumes of air Abd: soft, nontender, nondistended, BS present Ext: warm and well perfused, no pedal edema Neuro: alert and oriented X3, responding appropriately, MAE spontaneously  Lab Results:  CBC:  Recent Labs Lab 05/19/15 1839  05/21/15 0900 05/22/15 0405  WBC 6.3  < > 7.0 4.9  NEUTROABS 5.6  --   --   --   HGB 6.4*  < > 9.4* 9.4*  HCT 20.3*  < > 29.8* 29.8*  MCV 83.9  < > 86.1 85.1  PLT 253  < > 314 307  < > = values in this interval not displayed.  Medications: I have reviewed the patient's current medications. Scheduled Meds: . antiseptic oral rinse  7 mL Mouth Rinse BID  . lactobacillus acidophilus  2 tablet Oral TID  . pantoprazole  20 mg Oral BID  . sodium chloride  3 mL Intravenous Q12H   Continuous Infusions:  none   PRN Meds:.acetaminophen **OR** acetaminophen, cyclobenzaprine   Assessment/Plan: 81 year old with hx of Fe-deficiency anemia presented after syncopal episode with Hgb 6.4 and FOBT +.  Anemia 2/2 GI blood loss:  Likely due to excessive  NSAID use for low back pain.  Hgb stable at 9.4.  Asymptomatic. - patient advised to d/c NSAIDs - continue PPI at d/c - continue po Fe supplement at d/c - d/c to SNF once bed available - recommend CBC at SNF to monitor Hgb - follow-up with PCP after d/c from SNF  Syncope:  Likely symptomatic anemia and/or consequence of Zanaflex (hypotension). - treating anemia as above - d/c Zanaflex and start trial of Flexeril 5mg  TID prn (both patient and family cautioned that this med can still be sedating but less BP effect than Zanaflex) - continue telemetry  Low back pain:  Mild degenerative changes on xray.  She feels better today.   - patient advised to STOP NSAIDs as above - continue Tylenol prn and Flexeril prn  - d/c to SNF for rehab  Dispo: Pt is feeling well, back pain is better, hgb stable.  She is stable for d/c to SNF today.  The patient does have a current PCP Kelly Huddle, MD) and does not need an [facility] hospital follow-up appointment after discharge.  The patient does not know have transportation limitations that hinder transportation to clinic appointments.  .Services Needed at time of discharge: Y =  Yes, Blank = No PT:   OT:   RN:   Equipment:   Other:     LOS: 3 days   Kelly Oman, DO 05/22/2015, 12:19 PM

## 2015-12-15 ENCOUNTER — Other Ambulatory Visit: Payer: Self-pay | Admitting: Nurse Practitioner

## 2015-12-15 DIAGNOSIS — M79605 Pain in left leg: Secondary | ICD-10-CM

## 2015-12-16 ENCOUNTER — Ambulatory Visit (HOSPITAL_COMMUNITY)
Admission: RE | Admit: 2015-12-16 | Discharge: 2015-12-16 | Disposition: A | Discharge status: Home / Self Care | Payer: Medicare Other | Source: Ambulatory Visit | Attending: Vascular Surgery | Admitting: Vascular Surgery

## 2015-12-16 ENCOUNTER — Other Ambulatory Visit (HOSPITAL_COMMUNITY): Payer: Self-pay | Admitting: Internal Medicine

## 2015-12-16 DIAGNOSIS — M79605 Pain in left leg: Secondary | ICD-10-CM | POA: Diagnosis not present

## 2015-12-16 DIAGNOSIS — M79662 Pain in left lower leg: Secondary | ICD-10-CM | POA: Diagnosis present

## 2016-02-17 ENCOUNTER — Other Ambulatory Visit: Payer: Self-pay | Admitting: Internal Medicine

## 2016-02-17 ENCOUNTER — Ambulatory Visit
Admission: RE | Admit: 2016-02-17 | Discharge: 2016-02-17 | Disposition: A | Discharge status: Home / Self Care | Payer: Medicare Other | Source: Ambulatory Visit | Attending: Internal Medicine | Admitting: Internal Medicine

## 2016-02-17 ENCOUNTER — Ambulatory Visit: Payer: Self-pay

## 2016-02-17 DIAGNOSIS — M549 Dorsalgia, unspecified: Secondary | ICD-10-CM

## 2016-06-13 ENCOUNTER — Inpatient Hospital Stay
Admission: EM | Admit: 2016-06-13 | Discharge: 2016-06-17 | DRG: 389 | Disposition: A | Discharge status: Skilled Nursing Facility | Payer: Medicare Other | Attending: Internal Medicine | Admitting: Internal Medicine

## 2016-06-13 ENCOUNTER — Encounter: Payer: Self-pay | Admitting: Emergency Medicine

## 2016-06-13 ENCOUNTER — Emergency Department: Payer: Medicare Other

## 2016-06-13 DIAGNOSIS — E860 Dehydration: Secondary | ICD-10-CM | POA: Diagnosis present

## 2016-06-13 DIAGNOSIS — Z66 Do not resuscitate: Secondary | ICD-10-CM | POA: Diagnosis present

## 2016-06-13 DIAGNOSIS — Z681 Body mass index (BMI) 19 or less, adult: Secondary | ICD-10-CM | POA: Diagnosis not present

## 2016-06-13 DIAGNOSIS — M1990 Unspecified osteoarthritis, unspecified site: Secondary | ICD-10-CM | POA: Diagnosis present

## 2016-06-13 DIAGNOSIS — M545 Low back pain: Secondary | ICD-10-CM | POA: Diagnosis present

## 2016-06-13 DIAGNOSIS — G8929 Other chronic pain: Secondary | ICD-10-CM | POA: Diagnosis present

## 2016-06-13 DIAGNOSIS — H9190 Unspecified hearing loss, unspecified ear: Secondary | ICD-10-CM | POA: Diagnosis present

## 2016-06-13 DIAGNOSIS — D500 Iron deficiency anemia secondary to blood loss (chronic): Secondary | ICD-10-CM | POA: Diagnosis present

## 2016-06-13 DIAGNOSIS — R140 Abdominal distension (gaseous): Secondary | ICD-10-CM

## 2016-06-13 DIAGNOSIS — Z85820 Personal history of malignant melanoma of skin: Secondary | ICD-10-CM | POA: Diagnosis not present

## 2016-06-13 DIAGNOSIS — I480 Paroxysmal atrial fibrillation: Secondary | ICD-10-CM | POA: Diagnosis present

## 2016-06-13 DIAGNOSIS — R64 Cachexia: Secondary | ICD-10-CM | POA: Diagnosis present

## 2016-06-13 DIAGNOSIS — K529 Noninfective gastroenteritis and colitis, unspecified: Secondary | ICD-10-CM | POA: Diagnosis present

## 2016-06-13 DIAGNOSIS — Z79899 Other long term (current) drug therapy: Secondary | ICD-10-CM

## 2016-06-13 DIAGNOSIS — K567 Ileus, unspecified: Secondary | ICD-10-CM | POA: Diagnosis present

## 2016-06-13 DIAGNOSIS — R112 Nausea with vomiting, unspecified: Secondary | ICD-10-CM

## 2016-06-13 HISTORY — DX: Unspecified intestinal obstruction, unspecified as to partial versus complete obstruction: K56.609

## 2016-06-13 LAB — COMPREHENSIVE METABOLIC PANEL
ALT: 12 U/L — AB (ref 14–54)
AST: 32 U/L (ref 15–41)
Albumin: 3.1 g/dL — ABNORMAL LOW (ref 3.5–5.0)
Alkaline Phosphatase: 62 U/L (ref 38–126)
Anion gap: 10 (ref 5–15)
BUN: 42 mg/dL — AB (ref 6–20)
CHLORIDE: 98 mmol/L — AB (ref 101–111)
CO2: 25 mmol/L (ref 22–32)
Calcium: 8.2 mg/dL — ABNORMAL LOW (ref 8.9–10.3)
Creatinine, Ser: 0.93 mg/dL (ref 0.44–1.00)
GFR, EST AFRICAN AMERICAN: 59 mL/min — AB (ref >60)
GFR, EST NON AFRICAN AMERICAN: 51 mL/min — AB (ref >60)
GLUCOSE: 163 mg/dL — AB (ref 65–99)
POTASSIUM: 4.7 mmol/L (ref 3.5–5.1)
SODIUM: 133 mmol/L — AB (ref 135–145)
Total Bilirubin: 0.2 mg/dL — ABNORMAL LOW (ref 0.3–1.2)
Total Protein: 5.6 g/dL — ABNORMAL LOW (ref 6.5–8.1)

## 2016-06-13 LAB — GASTROINTESTINAL PANEL BY PCR, STOOL (REPLACES STOOL CULTURE)
ADENOVIRUS F40/41: NOT DETECTED
ASTROVIRUS: NOT DETECTED
CYCLOSPORA CAYETANENSIS: NOT DETECTED
Campylobacter species: NOT DETECTED
Cryptosporidium: NOT DETECTED
E. coli O157: NOT DETECTED
ENTAMOEBA HISTOLYTICA: NOT DETECTED
ENTEROAGGREGATIVE E COLI (EAEC): NOT DETECTED
ENTEROPATHOGENIC E COLI (EPEC): NOT DETECTED
ENTEROTOXIGENIC E COLI (ETEC): NOT DETECTED
Giardia lamblia: NOT DETECTED
Norovirus GI/GII: NOT DETECTED
Plesimonas shigelloides: NOT DETECTED
Rotavirus A: NOT DETECTED
SHIGA LIKE TOXIN PRODUCING E COLI (STEC): NOT DETECTED
Salmonella species: NOT DETECTED
Sapovirus (I, II, IV, and V): NOT DETECTED
Shigella/Enteroinvasive E coli (EIEC): NOT DETECTED
VIBRIO CHOLERAE: NOT DETECTED
VIBRIO SPECIES: NOT DETECTED
Yersinia enterocolitica: NOT DETECTED

## 2016-06-13 LAB — URINALYSIS COMPLETE WITH MICROSCOPIC (ARMC ONLY)
BILIRUBIN URINE: NEGATIVE
Glucose, UA: NEGATIVE mg/dL
Hgb urine dipstick: NEGATIVE
Ketones, ur: NEGATIVE mg/dL
LEUKOCYTES UA: NEGATIVE
Nitrite: NEGATIVE
PH: 5 (ref 5.0–8.0)
PROTEIN: NEGATIVE mg/dL
RBC / HPF: NONE SEEN RBC/hpf (ref 0–5)
SQUAMOUS EPITHELIAL / LPF: NONE SEEN
Specific Gravity, Urine: 1.017 (ref 1.005–1.030)
WBC, UA: NONE SEEN WBC/hpf (ref 0–5)

## 2016-06-13 LAB — CBC
HEMATOCRIT: 32.4 % — AB (ref 35.0–47.0)
Hemoglobin: 10.7 g/dL — ABNORMAL LOW (ref 12.0–16.0)
MCH: 29.1 pg (ref 26.0–34.0)
MCHC: 33 g/dL (ref 32.0–36.0)
MCV: 88.2 fL (ref 80.0–100.0)
Platelets: 310 10*3/uL (ref 150–440)
RBC: 3.67 MIL/uL — ABNORMAL LOW (ref 3.80–5.20)
RDW: 14.2 % (ref 11.5–14.5)
WBC: 5.6 10*3/uL (ref 3.6–11.0)

## 2016-06-13 LAB — OCCULT BLOOD X 1 CARD TO LAB, STOOL: Fecal Occult Bld: POSITIVE — AB

## 2016-06-13 LAB — C DIFFICILE QUICK SCREEN W PCR REFLEX
C DIFFICILE (CDIFF) INTERP: NOT DETECTED
C DIFFICILE (CDIFF) TOXIN: NEGATIVE
C Diff antigen: NEGATIVE

## 2016-06-13 LAB — LIPASE, BLOOD: LIPASE: 15 U/L (ref 11–51)

## 2016-06-13 MED ORDER — ACETAMINOPHEN 650 MG RE SUPP: 650.0000 mg | Freq: Four times a day (QID) | RECTAL | Status: DC | PRN

## 2016-06-13 MED ORDER — FENTANYL CITRATE (PF) 100 MCG/2ML IJ SOLN
25.0000 ug | INTRAMUSCULAR | Status: DC | PRN
  Administered 2016-06-13: 25 ug via INTRAVENOUS
  Filled 2016-06-13: qty 2

## 2016-06-13 MED ORDER — METOCLOPRAMIDE HCL 5 MG/ML IJ SOLN
10.0000 mg | Freq: Once | INTRAMUSCULAR | Status: AC
Start: 2016-06-13 — End: 2016-06-13
  Administered 2016-06-13: 10 mg via INTRAVENOUS
  Filled 2016-06-13: qty 2

## 2016-06-13 MED ORDER — METRONIDAZOLE IN NACL 5-0.79 MG/ML-% IV SOLN
500.0000 mg | Freq: Three times a day (TID) | INTRAVENOUS | Status: DC
  Administered 2016-06-13 – 2016-06-15 (×5): 500 mg via INTRAVENOUS
  Filled 2016-06-13 (×7): qty 100

## 2016-06-13 MED ORDER — METRONIDAZOLE IN NACL 5-0.79 MG/ML-% IV SOLN
500.0000 mg | Freq: Once | INTRAVENOUS | Status: AC
  Administered 2016-06-13: 500 mg via INTRAVENOUS
  Filled 2016-06-13: qty 100

## 2016-06-13 MED ORDER — ONDANSETRON HCL 4 MG PO TABS: 4.0000 mg | ORAL_TABLET | Freq: Four times a day (QID) | ORAL | Status: DC | PRN

## 2016-06-13 MED ORDER — MORPHINE SULFATE (PF) 4 MG/ML IV SOLN: 4.0000 mg | INTRAVENOUS | Status: DC | PRN

## 2016-06-13 MED ORDER — MORPHINE SULFATE (PF) 2 MG/ML IV SOLN: 2.0000 mg | INTRAVENOUS | Status: DC | PRN

## 2016-06-13 MED ORDER — ACETAMINOPHEN 325 MG PO TABS
650.0000 mg | ORAL_TABLET | Freq: Four times a day (QID) | ORAL | Status: DC | PRN
Start: 2016-06-13 — End: 2016-06-17

## 2016-06-13 MED ORDER — IOPAMIDOL (ISOVUE-300) INJECTION 61%
75.0000 mL | Freq: Once | INTRAVENOUS | Status: AC | PRN
  Administered 2016-06-13: 75 mL via INTRAVENOUS

## 2016-06-13 MED ORDER — FAMOTIDINE IN NACL 20-0.9 MG/50ML-% IV SOLN
20.0000 mg | Freq: Two times a day (BID) | INTRAVENOUS | Status: DC
  Administered 2016-06-13 – 2016-06-14 (×2): 20 mg via INTRAVENOUS
  Filled 2016-06-13 (×3): qty 50

## 2016-06-13 MED ORDER — KCL IN DEXTROSE-NACL 20-5-0.9 MEQ/L-%-% IV SOLN
INTRAVENOUS | Status: AC
  Administered 2016-06-13 – 2016-06-14 (×2): via INTRAVENOUS
  Filled 2016-06-13 (×2): qty 1000

## 2016-06-13 MED ORDER — LEVOFLOXACIN IN D5W 750 MG/150ML IV SOLN
750.0000 mg | INTRAVENOUS | Status: DC
  Administered 2016-06-13: 750 mg via INTRAVENOUS
  Filled 2016-06-13 (×2): qty 150

## 2016-06-13 MED ORDER — SODIUM CHLORIDE 0.9 % IV BOLUS (SEPSIS)
1000.0000 mL | Freq: Once | INTRAVENOUS | Status: AC
  Administered 2016-06-13: 1000 mL via INTRAVENOUS

## 2016-06-13 MED ORDER — DIATRIZOATE MEGLUMINE & SODIUM 66-10 % PO SOLN
15.0000 mL | ORAL | Status: AC
  Administered 2016-06-13: 15 mL via ORAL
  Administered 2016-06-13: 30 mL via ORAL

## 2016-06-13 MED ORDER — ONDANSETRON HCL 4 MG/2ML IJ SOLN: 4.0000 mg | Freq: Four times a day (QID) | INTRAMUSCULAR | Status: DC | PRN

## 2016-06-13 NOTE — ED Notes (Signed)
Family at bedside with patient.

## 2016-06-13 NOTE — ED Notes (Signed)
Pt cleaned up and put a clean brief on at this time.

## 2016-06-13 NOTE — ED Notes (Signed)
Pt cleaned up and a clean brief put on patient. NAD noted at this time.

## 2016-06-13 NOTE — H&P (Signed)
[place] [facility] [place] NAME: Kelly Porter    MR#:  [id number]  DATE OF BIRTH:  1918-12-27  DATE OF ADMISSION:  06/13/2016  PRIMARY CARE PHYSICIAN: Henrine Screws, MD   REQUESTING/REFERRING PHYSICIAN: Dr. Quentin Cornwall  CHIEF COMPLAINT:  Nausea vomiting and abdominal pain  HISTORY OF PRESENT ILLNESS:  Kelly Porter  is a 82 y.o. female with a known history of Chronic low back pain, chronic anemia on iron tablets, chronic anxiety, lives alone, DO NOT RESUSCITATE was in her usual state of health until last night. Patient started complaining of lower abdominal pain mainly in the left lower quadrant and was experiencing nausea and vomiting persistently. She had one bowel movement in the ED which was dark and tarry looking but not diarrhea. Patient had a past medical history of C. difficile colitis according to the family members. Patient is hard of hearing. Family members are providing the history. CT abdomen has revealed partial small bowel obstruction  PAST MEDICAL HISTORY:   Past Medical History:  Diagnosis Date  . Anemia "always"  . Anxiety   . Arthritis    "back" (05/19/2015)  . Bowel obstruction ([place])   . Chronic lower back pain    "qd for the last month" (05/19/2015)  . History of blood transfusion 05/19/2015   "low HgB"  . Melanoma of face ([place])    "skin only; no other treatment"  . Pneumonia X 1    PAST SURGICAL HISTOIRY:   Past Surgical History:  Procedure Laterality Date  . ABDOMINAL EXPLORATION SURGERY     "thought it was her ovaries; found diverticulitis"  . MELANOMA EXCISION     face    SOCIAL HISTORY:   Social History  Substance Use Topics  . Smoking status: Never Smoker  . Smokeless tobacco: Never Used  . Alcohol use No    FAMILY HISTORY:  No family history on file.  DRUG ALLERGIES:  No Known Allergies  REVIEW OF SYSTEMS:  CONSTITUTIONAL: No fever, fatigue or weakness.  EYES: No blurred or double vision.   EARS, NOSE, AND THROAT: No tinnitus or ear pain.  RESPIRATORY: No cough, shortness of breath, wheezing or hemoptysis.  CARDIOVASCULAR: No chest pain, orthopnea, edema.  GASTROINTESTINAL: Reporting nausea, vomiting, and lower abdominal pain. Denies any diarrhea usually patient is constipated according to the family members GENITOURINARY: No dysuria, hematuria.  ENDOCRINE: No polyuria, nocturia,  HEMATOLOGY: No anemia, easy bruising or bleeding SKIN: No rash or lesion. MUSCULOSKELETAL: No joint pain or arthritis.   NEUROLOGIC: No tingling, numbness, weakness.  PSYCHIATRY: No anxiety or depression.   MEDICATIONS AT HOME:   Prior to Admission medications   Medication Sig Start Date End Date Taking? Authorizing Provider  Cholecalciferol (VITAMIN D3) 1000 units CAPS Take 1,000 Units by mouth 2 (two) times daily.   Yes Historical Provider, MD  Cranberry 500 MG CHEW Chew 500 mg by mouth daily.   Yes Historical Provider, MD  ferrous sulfate 325 (65 FE) MG tablet Take 325 mg by mouth daily with breakfast.   Yes Historical Provider, MD  Multiple Vitamins-Minerals (MULTIVITAMIN ADULT PO) Take 1 tablet by mouth daily.   Yes Historical Provider, MD  polyethylene glycol (MIRALAX / GLYCOLAX) packet Take 17 g by mouth daily as needed for moderate constipation.   Yes Historical Provider, MD  traMADol (ULTRAM) 50 MG tablet Take 25 mg by mouth every 6 (six) hours as needed for moderate pain or severe pain.   Yes Historical Provider, MD  vitamin C (ASCORBIC ACID) 500 MG tablet Take 500 mg by mouth daily.   Yes Historical Provider, MD  cyclobenzaprine (FLEXERIL) 5 MG tablet Take 1 tablet (5 mg total) by mouth 3 (three) times daily as needed for muscle spasms. Patient not taking: Reported on 06/13/2016 05/22/15   [name], DO  pantoprazole (PROTONIX) 40 MG tablet Take 1 tablet (40 mg total) by mouth daily. Patient not taking: Reported on 06/13/2016 05/22/15   [name], DO      VITAL SIGNS:  Blood  pressure (!) 130/57, pulse 100, temperature 97.5 F (36.4 C), temperature source Oral, resp. rate (!) 36, height 5\' 1"  (1.549 m), weight 40.4 kg (89 lb), SpO2 95 %.  PHYSICAL EXAMINATION:  GENERAL:  82 y.o.-year-old patient lying in the bed with no acute distress.  EYES: Pupils equal, round, reactive to light and accommodation. No scleral icterus. Extraocular muscles intact.  HEENT: Head atraumatic, normocephalic. Oropharynx and nasopharynx clear.  NECK:  Supple, no jugular venous distention. No thyroid enlargement, no tenderness.  LUNGS: Normal breath sounds bilaterally, no wheezing, rales,rhonchi or crepitation. No use of accessory muscles of respiration.  CARDIOVASCULAR: S1, S2 normal. No murmurs, rubs, or gallops.  ABDOMEN: Soft, Lower abdominaltender but no rebound tenderness nondistended. Hypoactive Bowel sounds present in 2 quadrants. No organomegaly   EXTREMITIES: No pedal edema, cyanosis, or clubbing.  NEUROLOGIC: Cranial nerves II through XII are intact. Muscle strength at her baseline in all extremities. Sensation intact. Gait not checked.  PSYCHIATRIC: The patient is alert and oriented x 3.  SKIN: No obvious rash, lesion, or ulcer.   LABORATORY PANEL:   CBC  Recent Labs Lab 06/13/16 1144  WBC 5.6  HGB 10.7*  HCT 32.4*  PLT 310   ------------------------------------------------------------------------------------------------------------------  Chemistries   Recent Labs Lab 06/13/16 1144  NA 133*  K 4.7  CL 98*  CO2 25  GLUCOSE 163*  BUN 42*  CREATININE 0.93  CALCIUM 8.2*  AST 32  ALT 12*  ALKPHOS 62  BILITOT 0.2*   ------------------------------------------------------------------------------------------------------------------  Cardiac Enzymes No results for input(s): TROPONINI in the last 168 hours. ------------------------------------------------------------------------------------------------------------------  RADIOLOGY:  Dg Abdomen 1  View  Result Date: 06/13/2016 CLINICAL DATA:  Vomiting, abdominal pain. EXAM: ABDOMEN - 1 VIEW COMPARISON:  CT 10/17/2008 FINDINGS: Mild gaseous distention of bowel, predominantly colon. No evidence of bowel obstruction. No free air organomegaly. Prior left hip replacement. No acute bony abnormality. IMPRESSION: Mild gaseous distention of the colon, possibly mild ileus. Electronically Signed   By: Rolm Baptise M.D.   On: 06/13/2016 12:15   Ct Abdomen Pelvis W Contrast  Result Date: 06/13/2016 CLINICAL DATA:  Vomiting. EXAM: CT ABDOMEN AND PELVIS WITH CONTRAST TECHNIQUE: Multidetector CT imaging of the abdomen and pelvis was performed using the standard protocol following bolus administration of intravenous contrast. CONTRAST:  1mL ISOVUE-300 IOPAMIDOL (ISOVUE-300) INJECTION 61% COMPARISON:  None FINDINGS: Lower chest: Small bilateral pleural effusions are identified right greater than left. Thoracic aortic calcifications noted. There also calcifications within the RCA, LAD and left circumflex coronary artery. Hepatobiliary: For no focal liver abnormalities. The gallbladder is unremarkable. No biliary dilatation. Pancreas: No mass, inflammatory changes, or other significant abnormality. Spleen: Within normal limits in size and appearance. Adrenals/Urinary Tract: Normal adrenal glands. The kidneys are unremarkable. No mass or obstructive uropathy. The urinary bladder appears normal. Stomach/Bowel: Small hiatal hernia. The small bowel loops are increased in caliber measuring up to 2.7 cm. There is abnormal scratch set there is marked wall thickening involving  the distal small bowel loops which measure up to 1.5 cm, image 63 of series 2. Dilute contrast is identified within the proximal colon. Extensive diverticular disease involves the descending colon and sigmoid colon. No evidence for diverticulitis. Vascular/Lymphatic: Aortic atherosclerosis noted. No aneurysm. No enlarged abdominal or pelvic adenopathy.  Reproductive: No mass or other significant abnormality. Other: There is a small to moderate amount of ascites identified within the upper abdomen and pelvis. No free air identified. Musculoskeletal: Degenerative disc disease is identified within the lumbar spine. No suspicious bone lesions identified. IMPRESSION: 1. Examination is positive for abnormal bowel wall thickening involving the distal small bowel loops compatible with no evidence for perforation or abscess. 2. Abnormal small bowel dilatation which may reflect ileus or partial obstruction secondary to distal small bowel inflammation. 3. Abdominal and pelvic ascites.  No abscess identified. 4. Aortic atherosclerosis. 5. Small hiatal hernia. 6. Small pleural effusions. Electronically Signed   By: Kerby Moors M.D.   On: 06/13/2016 14:17    EKG:   Orders placed or performed during the hospital encounter of 05/19/15  . EKG 12-Lead  . EKG 12-Lead  . EKG    IMPRESSION AND PLAN:  Kelly Porter  is a 82 y.o. female with a known history of Chronic low back pain, chronic anemia on iron tablets, chronic anxiety, lives alone, DO NOT RESUSCITATE was in her usual state of health until last night. Patient started complaining of lower abdominal pain mainly in the left lower quadrant and was experiencing nausea and vomiting persistently. She had one bowel movement in the ED which was dark and tarry looking but not diarrhea.  #Intractable nausea and vomiting with abdominal pain secondary to ileus Admit to MedSurg unit Nothing by mouth, Pepcid IV NG tube to low intermittent suction for persistent nausea and vomiting when necessary IV fluids to D5 normal saline with 20 K Stool for occult blood and C. difficile toxin Enteric precautions IV antibiotics Levaquin and Flagyl Check a.m. Labs Patient and family members at refusing surgery considering her age and patient being DO NOT RESUSCITATE  #Chronic anemia Patient takes iron tablets according to the  family members Stool was blackish according to the ED physician which could give black stool, will check stool for Hemoccult and also for culture   #Chronic low back pain Provide pain management as needed basis. Currently patient is asymptomatic  #Chronic anxiety will provide anxiolytics when necessary   DVT prophylaxis with SCDs GI prophylaxis with Pepcid   All the records are reviewed and case discussed with ED provider. Management plans discussed with the patient, family and they are in agreement.  CODE STATUS: DNR -POA-Ms  Kelly Porter  TOTAL TIME TAKING CARE OF THIS PATIENT: 45  minutes.   Note: This dictation was prepared with Dragon dictation along with smaller phrase technology. Any transcriptional errors that result from this process are unintentional.  Kelly Porter M.D on 06/13/2016 at 3:17 PM  Between 7am to 6pm - Pager - [phone]  After 6pm go to [email or web address] - password [email or web address] [place]  Office  [phone]  CC: Primary care physician; Henrine Screws, MD

## 2016-06-13 NOTE — ED Provider Notes (Signed)
[facility] Emergency Department Provider Note    First MD Initiated Contact with Patient 06/13/16 1158     (approximate)  I have reviewed the triage vital signs and the nursing notes.   HISTORY  Chief Complaint Emesis    HPI Kelly Porter is a 82 y.o. female history of anemia and previous bowel obstruction presents with 4 days of severe diffuse abdominal pain rated 9 out of 10 associated with constipation, nausea and vomiting. Per report the patient states that she generally has very dark stools because she takes an iron supplementation. Has been tested multiple times in the [facility]. Her vomit has been dark in color. She's not been able to keep anything down other than Pedialyte for the past 4 days. Denies any recent fevers. Denies any shortness of breath or chest pain.   Past Medical History:  Diagnosis Date  . Anemia "always"  . Anxiety   . Arthritis    "back" (05/19/2015)  . Bowel obstruction ([place])   . Chronic lower back pain    "qd for the last month" (05/19/2015)  . History of blood transfusion 05/19/2015   "low HgB"  . Melanoma of face ([place])    "skin only; no other treatment"  . Pneumonia X 1    Patient Active Problem List   Diagnosis Date Noted  . Back pain   . Iron deficiency anemia due to chronic blood loss   . Fall (on)(from) incline, initial encounter   . Chronic GI bleeding   . Syncope 05/19/2015    Past Surgical History:  Procedure Laterality Date  . ABDOMINAL EXPLORATION SURGERY     "thought it was her ovaries; found diverticulitis"  . MELANOMA EXCISION     face    Prior to Admission medications   Medication Sig Start Date End Date Taking? Authorizing Provider  CALCIUM PO Take 1 tablet by mouth daily.    Historical Provider, MD  cyclobenzaprine (FLEXERIL) 5 MG tablet Take 1 tablet (5 mg total) by mouth 3 (three) times daily as needed for muscle spasms. 05/22/15   [name], DO  IRON PO Take 1 tablet by mouth  daily.    Historical Provider, MD  Multiple Vitamins-Minerals (MULTIVITAMIN ADULT PO) Take 1 tablet by mouth daily.    Historical Provider, MD  pantoprazole (PROTONIX) 40 MG tablet Take 1 tablet (40 mg total) by mouth daily. 05/22/15   [name], DO  polyethylene glycol [facility] / GLYCOLAX) packet Take 17 g by mouth daily as needed for moderate constipation.    Historical Provider, MD    Allergies Review of patient's allergies indicates no known allergies.  [place]: no bleeding disorders  Social History Social History  Substance Use Topics  . Smoking status: Never Smoker  . Smokeless tobacco: Never Used  . Alcohol use No    Review of Systems Patient denies headaches, rhinorrhea, blurry vision, numbness, shortness of breath, chest pain, edema, cough, abdominal pain, nausea, vomiting, diarrhea, dysuria, fevers, rashes or hallucinations unless otherwise stated above in HPI. ____________________________________________   PHYSICAL EXAM:  VITAL SIGNS: Vitals:   06/13/16 1118  BP: (!) 111/56  Pulse: (!) 106  Resp: 18  Temp: 97.5 F (36.4 C)    Constitutional: Alert and oriented. Ill appearing cachectic Eyes: Conjunctivae are normal. PERRL. EOMI. Head: Atraumatic. Nose: No congestion/rhinnorhea. Mouth/Throat: Mucous membranes are dry.  Oropharynx non-erythematous. Neck: No stridor. Painless ROM. No cervical spine tenderness to palpation Hematological/Lymphatic/Immunilogical: No cervical lymphadenopathy. Cardiovascular: Normal rate,  regular rhythm. Grossly normal heart sounds.  Good peripheral circulation. Respiratory: Normal respiratory effort.  No retractions. Lungs CTAB. Gastrointestinal: Generalized tenderness with intermittent guarding.. No distention. No abdominal bruits. No CVA tenderness. Genitourinary:  Musculoskeletal: No lower extremity tenderness nor edema.  No joint effusions. Neurologic:  Normal speech and language. No gross focal neurologic deficits are appreciated.  No gait instability. Skin:  Skin is warm, dry and intact. No rash noted.  ____________________________________________   LABS (all labs ordered are listed, but only abnormal results are displayed)  Results for orders placed or performed during the hospital encounter of 06/13/16 (from the past 24 hour(s))  Lipase, blood     Status: None   Collection Time: 06/13/16 11:44 AM  Result Value Ref Range   Lipase 15 11 - 51 U/L  Comprehensive metabolic panel     Status: Abnormal   Collection Time: 06/13/16 11:44 AM  Result Value Ref Range   Sodium 133 (L) 135 - 145 mmol/L   Potassium 4.7 3.5 - 5.1 mmol/L   Chloride 98 (L) 101 - 111 mmol/L   CO2 25 22 - 32 mmol/L   Glucose, Bld 163 (H) 65 - 99 mg/dL   BUN 42 (H) 6 - 20 mg/dL   Creatinine, Ser 0.93 0.44 - 1.00 mg/dL   Calcium 8.2 (L) 8.9 - 10.3 mg/dL   Total Protein 5.6 (L) 6.5 - 8.1 g/dL   Albumin 3.1 (L) 3.5 - 5.0 g/dL   AST 32 15 - 41 U/L   ALT 12 (L) 14 - 54 U/L   Alkaline Phosphatase 62 38 - 126 U/L   Total Bilirubin 0.2 (L) 0.3 - 1.2 mg/dL   GFR calc non Af Amer 51 (L) >60 mL/min   GFR calc Af Amer 59 (L) >60 mL/min   Anion gap 10 5 - 15  CBC     Status: Abnormal   Collection Time: 06/13/16 11:44 AM  Result Value Ref Range   WBC 5.6 3.6 - 11.0 K/uL   RBC 3.67 (L) 3.80 - 5.20 MIL/uL   Hemoglobin 10.7 (L) 12.0 - 16.0 g/dL   HCT 32.4 (L) 35.0 - 47.0 %   MCV 88.2 80.0 - 100.0 fL   MCH 29.1 26.0 - 34.0 pg   MCHC 33.0 32.0 - 36.0 g/dL   RDW 14.2 11.5 - 14.5 %   Platelets 310 150 - 440 K/uL  Urinalysis complete, with microscopic     Status: Abnormal   Collection Time: 06/13/16  1:19 PM  Result Value Ref Range   Color, Urine YELLOW (A) YELLOW   APPearance CLEAR (A) CLEAR   Glucose, UA NEGATIVE NEGATIVE mg/dL   Bilirubin Urine NEGATIVE NEGATIVE   Ketones, ur NEGATIVE NEGATIVE mg/dL   Specific Gravity, Urine 1.017 1.005 - 1.030   Hgb urine dipstick NEGATIVE NEGATIVE   pH 5.0 5.0 - 8.0   Protein, ur NEGATIVE NEGATIVE mg/dL    Nitrite NEGATIVE NEGATIVE   Leukocytes, UA NEGATIVE NEGATIVE   RBC / HPF NONE SEEN 0 - 5 RBC/hpf   WBC, UA NONE SEEN 0 - 5 WBC/hpf   Bacteria, UA RARE (A) NONE SEEN   Squamous Epithelial / LPF NONE SEEN NONE SEEN  Occult blood card to lab, stool RN will collect     Status: Abnormal   Collection Time: 06/13/16  8:00 PM  Result Value Ref Range   Fecal Occult Bld POSITIVE (A) NEGATIVE   ____________________________________________  ____________________________________________  RADIOLOGY  CT abd/pelv IMPRESSION: 1. Examination is positive for  abnormal bowel wall thickening involving the distal small bowel loops compatible with no evidence for perforation or abscess. 2. Abnormal small bowel dilatation which may reflect ileus or partial obstruction secondary to distal small bowel inflammation. 3. Abdominal and pelvic ascites.  No abscess identified. 4. Aortic atherosclerosis. 5. Small hiatal hernia. 6. Small pleural effusions. ____________________________________________   PROCEDURES  Procedure(s) performed: none    Critical Care performed: no ____________________________________________   INITIAL IMPRESSION / ASSESSMENT AND PLAN / ED COURSE  Pertinent labs & imaging results that were available during my care of the patient were reviewed by me and considered in my medical decision making (see chart for details).  DDX: Small bowel obstruction, dehydration, enteritis, mass, volvulus  MANEET MCDUFFEY is a 82 y.o. who presents to the ED with 4 days of abdominal pain, constipation nausea and vomiting. Patient is afebrile but severely dehydrated appearing. Patient does appear acutely ill. Based on her abdominal exam stat KUB was ordered which shows nonspecific bowel dilatation, no free air. We will order CT scan to further characterize as I am concerned for acute obstructive process. Patient will be provided IV fluid resuscitation for acute dehydration.  The patient will be placed  on continuous pulse oximetry and telemetry for monitoring.  Laboratory evaluation will be sent to evaluate for the above complaints.     Clinical Course  Comment By Time  Stool is guaiac positive Merlyn Lot, MD 08/27 1322  CT imaging with evidence of probable colitis without evidence of full obstruction there is evidence of partial obstruction. Patient remains hemodynamically stable at this time. Will provide IV Flagyl due to concern for acute colitis as the patient has had C. difficile in the past. Will continue providing IV fluid resuscitation. Based on her persistent symptoms feel patient will require admission for further evaluation and management. Have discussed with the patient and available family all diagnostics and treatments performed thus far and all questions were answered to the best of my ability. The patient demonstrates understanding and agreement with plan.    ____________________________________________   FINAL CLINICAL IMPRESSION(S) / ED DIAGNOSES  Final diagnoses:  Non-intractable vomiting with nausea, vomiting of unspecified type  Colitis  Dehydration      NEW MEDICATIONS STARTED DURING THIS VISIT:  New Prescriptions   No medications on file     Note:  This document was prepared using Dragon voice recognition software and may include unintentional dictation errors.    Merlyn Lot, MD 06/13/16 2043

## 2016-06-13 NOTE — ED Triage Notes (Signed)
Family states vomiting that started on Friday. Pt has not had bowel movement since Thursday. Pt had hemoglobin level check 3 weeks ago and was noted to be 8. Pt tongue noted to be dark in color by caregiver.

## 2016-06-13 NOTE — ED Notes (Signed)
NAD noted at this time. PT resting in bed with eyes closed, respirations even and unlabored. Will continue to monitor for further patient needs.

## 2016-06-13 NOTE — ED Notes (Signed)
Pt taken to CT.

## 2016-06-13 NOTE — ED Notes (Signed)
Report given to [place], RN.  

## 2016-06-13 NOTE — Consult Note (Signed)
Pharmacy Antibiotic Note  Kelly Porter is a 82 y.o. female admitted on 06/13/2016 with intra-abdominal infections/ileus.  Pharmacy has been consulted for levofloxacin dosing. Patient is also on metronidazole  Plan: levofloxacin 750mg  q 48 hours  Height: 5\' 1"  (154.9 cm) Weight: 95 lb 12.8 oz (43.5 kg) IBW/kg (Calculated) : 47.8  Temp (24hrs), Avg:98.7 F (37.1 C), Min:97.5 F (36.4 C), Max:99.8 F (37.7 C)   Recent Labs Lab 06/13/16 1144  WBC 5.6  CREATININE 0.93    Estimated Creatinine Clearance: 24.8 mL/min (by C-G formula based on SCr of 0.93 mg/dL).    No Known Allergies  Antimicrobials this admission: levofloxacin 8/27 >>  metronidazole 8/27 >>   Dose adjustments this admission:   Microbiology results:   Thank you for allowing pharmacy to be a part of this patient's care.  Ramond Dial, Pharm.D Clinical Pharmacist  06/13/2016 6:18 PM

## 2016-06-13 NOTE — ED Notes (Signed)
Medic called to transport patient.

## 2016-06-13 NOTE — ED Notes (Signed)
Pt noted to desat to 87-88% on RA. Pt placed on 2L O2 via [place] by this RN. MD made aware at this time.

## 2016-06-14 LAB — CBC
HCT: 22.6 % — ABNORMAL LOW (ref 35.0–47.0)
Hemoglobin: 7.6 g/dL — ABNORMAL LOW (ref 12.0–16.0)
MCH: 29.1 pg (ref 26.0–34.0)
MCHC: 33.4 g/dL (ref 32.0–36.0)
MCV: 87.1 fL (ref 80.0–100.0)
PLATELETS: 205 10*3/uL (ref 150–440)
RBC: 2.6 MIL/uL — ABNORMAL LOW (ref 3.80–5.20)
RDW: 14.1 % (ref 11.5–14.5)
WBC: 7 10*3/uL (ref 3.6–11.0)

## 2016-06-14 LAB — COMPREHENSIVE METABOLIC PANEL
ALK PHOS: 49 U/L (ref 38–126)
ALT: 11 U/L — AB (ref 14–54)
AST: 26 U/L (ref 15–41)
Albumin: 2.3 g/dL — ABNORMAL LOW (ref 3.5–5.0)
Anion gap: 5 (ref 5–15)
BILIRUBIN TOTAL: 0.3 mg/dL (ref 0.3–1.2)
BUN: 39 mg/dL — AB (ref 6–20)
CALCIUM: 7 mg/dL — AB (ref 8.9–10.3)
CHLORIDE: 103 mmol/L (ref 101–111)
CO2: 26 mmol/L (ref 22–32)
CREATININE: 0.9 mg/dL (ref 0.44–1.00)
GFR calc Af Amer: >60 mL/min (ref >60)
GFR, EST NON AFRICAN AMERICAN: 53 mL/min — AB (ref >60)
Glucose, Bld: 122 mg/dL — ABNORMAL HIGH (ref 65–99)
Potassium: 4.3 mmol/L (ref 3.5–5.1)
Sodium: 134 mmol/L — ABNORMAL LOW (ref 135–145)
TOTAL PROTEIN: 4.3 g/dL — AB (ref 6.5–8.1)

## 2016-06-14 LAB — IRON AND TIBC: TIBC: 194 ug/dL — ABNORMAL LOW (ref 250–450)

## 2016-06-14 LAB — ABO/RH: ABO/RH(D): O POS

## 2016-06-14 LAB — HEMOGLOBIN AND HEMATOCRIT, BLOOD
HEMATOCRIT: 28.4 % — AB (ref 35.0–47.0)
HEMOGLOBIN: 9.5 g/dL — AB (ref 12.0–16.0)

## 2016-06-14 LAB — PREPARE RBC (CROSSMATCH)

## 2016-06-14 LAB — FERRITIN: Ferritin: 63 ng/mL (ref 11–307)

## 2016-06-14 MED ORDER — SODIUM CHLORIDE 0.9 % IV SOLN
Freq: Once | INTRAVENOUS | Status: AC
Start: 2016-06-14 — End: 2016-06-14
  Administered 2016-06-14: 14:00:00 via INTRAVENOUS

## 2016-06-14 MED ORDER — PANTOPRAZOLE SODIUM 40 MG IV SOLR
40.0000 mg | Freq: Two times a day (BID) | INTRAVENOUS | Status: DC
  Administered 2016-06-14 – 2016-06-15 (×3): 40 mg via INTRAVENOUS
  Filled 2016-06-14 (×3): qty 40

## 2016-06-14 NOTE — Clinical Social Work Note (Signed)
Ms. Danny Lawless returned [place] phone call and confirmed that patient has been at [facility] in the past and that she would want her to go there again if appropriate. CSW to send referral to [facility] today. Shela Leff MSW,LCSW [phone]

## 2016-06-14 NOTE — Clinical Social Work Note (Signed)
Clinical Social Work Assessment  Patient Details  Name: Kelly Porter MRN: [id number] Date of Birth: Sep 25, 1919  Date of referral:  06/14/16               Reason for consult:  Facility Placement                Permission sought to share information with:  Facility Sport and exercise psychologist, Family Supports Permission granted to share information::  Yes, Verbal Permission Granted  Name::        Agency::     Relationship::     Contact Information:     Housing/Transportation Living arrangements for the past 2 months:  Single Family Home Source of Information:  Patient Patient Interpreter Needed:  None Criminal Activity/Legal Involvement Pertinent to Current Situation/Hospitalization:  No - Comment as needed Significant Relationships:   (nieces and nephews) Lives with:  Self Do you feel safe going back to the place where you live?  Yes Need for family participation in patient care:  Yes (Comment)  Care giving concerns:  Patient lives alone.   Social Worker assessment / plan:  CSW informed during progression rounds by nursing that patient's family had mentioned rehab for patient at discharge. CSW met with patient to explain role and purpose of visit. Patient  stated she lives alone but she has many family members that check in on her. When CSW asked if she would be willing to go to rehab, she initially said she didn't want to, but then stated if her family felt it was best she would. Patient stated that she has been to [facility] in the past. Patient's nurse to obtain order for PT so that patient can be assessed and recommendations can be made. FL2 and pasrr obtained in the event STR is needed. Message left for patient's niece: Fonnie Birkenhead: [phone].  Employment status:  Retired Forensic scientist:  Medicare PT Recommendations:  Not assessed at this time Information / Referral to community resources:     Patient/Family's Response to care:  Patient expressed appreciation for CSW  assistance.  Patient/Family's Understanding of and Emotional Response to Diagnosis, Current Treatment, and Prognosis:  Patient is unsure what her needs might be at this time but is willing to consider rehab if needed.  Emotional Assessment Appearance:  Appears stated age Attitude/Demeanor/Rapport:   (pleasanat and cooperative) Affect (typically observed):  Accepting, Adaptable, Pleasant, Appropriate Orientation:  Oriented to Self, Oriented to Place, Oriented to  Time, Oriented to Situation Alcohol / Substance use:  Not Applicable Psych involvement (Current and /or in the community):  No (Comment)  Discharge Needs  Concerns to be addressed:  Care Coordination Readmission within the last 30 days:  No Current discharge risk:  None Barriers to Discharge:  No Barriers Identified   Shela Leff, LCSW 06/14/2016, 11:15 AM

## 2016-06-14 NOTE — NC FL2 (Signed)
[place] LEVEL OF CARE SCREENING TOOL     IDENTIFICATION  Patient Name: Kelly Porter Birthdate: 1919-05-03 Sex: female Admission Date (Current Location): 06/13/2016  [place] and [place] Number:  Engineering geologist and Address:  [facility], [address] [place],  [address]      Provider Number: [id number]  Attending Physician Name and Address:  Dustin Flock, MD  Relative Name and Phone Number:       Current Level of Care: Hospital Recommended Level of Care: [place] Prior Approval Number:    Date Approved/Denied:   PASRR Number: [id number]  Discharge Plan: SNF    Current Diagnoses: Patient Active Problem List   Diagnosis Date Noted  . Ileus ([place]) 06/13/2016  . Back pain   . Iron deficiency anemia due to chronic blood loss   . Fall (on)(from) incline, initial encounter   . Chronic GI bleeding   . Syncope 05/19/2015    Orientation RESPIRATION BLADDER Height & Weight     Self, Time, Situation, Place  Normal Incontinent Weight: 95 lb 12.8 oz (43.5 kg) Height:  5\' 1"  (154.9 cm)  BEHAVIORAL SYMPTOMS/MOOD NEUROLOGICAL BOWEL NUTRITION STATUS   (none)  (none) Continent Diet (clear liquid but to be advanced)  AMBULATORY STATUS COMMUNICATION OF NEEDS Skin   Limited Assist Verbally Normal                       Personal Care Assistance Level of Assistance  Bathing, Dressing Bathing Assistance: Limited assistance   Dressing Assistance: Limited assistance     Functional Limitations Info  Hearing   Hearing Info: Impaired      SPECIAL CARE FACTORS FREQUENCY  PT (By licensed PT)                    Contractures Contractures Info: Not present    Additional Factors Info  Code Status Code Status Info: dnr             Current Medications (06/14/2016):  This is the current hospital active medication list Current Facility-Administered Medications  Medication Dose Route Frequency  Provider Last Rate Last Dose  . 0.9 %  sodium chloride infusion   Intravenous Once Dustin Flock, MD      . acetaminophen (TYLENOL) tablet 650 mg  650 mg Oral Q6H PRN Nicholes Mango, MD       Or  . acetaminophen (TYLENOL) suppository 650 mg  650 mg Rectal Q6H PRN Aruna Gouru, MD      . dextrose 5 % and 0.9 % NaCl with KCl 20 mEq/L infusion   Intravenous Continuous Nicholes Mango, MD 75 mL/hr at 06/13/16 1946    . famotidine (PEPCID) IVPB 20 mg premix  20 mg Intravenous Q12H Nicholes Mango, MD   20 mg at 06/14/16 0909  . levofloxacin (LEVAQUIN) IVPB 750 mg  750 mg Intravenous Q48H Melissa D Maccia, RPH   750 mg at 06/13/16 1947  . metroNIDAZOLE (FLAGYL) IVPB 500 mg  500 mg Intravenous Q8H Nicholes Mango, MD   500 mg at 06/14/16 0515  . morphine 2 MG/ML injection 2 mg  2 mg Intravenous Q4H PRN Nicholes Mango, MD      . morphine 4 MG/ML injection 4 mg  4 mg Intravenous Q4H PRN Aruna Gouru, MD      . ondansetron (ZOFRAN) tablet 4 mg  4 mg Oral Q6H PRN Nicholes Mango, MD       Or  .  ondansetron (ZOFRAN) injection 4 mg  4 mg Intravenous Q6H PRN Nicholes Mango, MD         Discharge Medications: Please see discharge summary for a list of discharge medications.  Relevant Imaging Results:  Relevant Lab Results:   Additional Information ss: [id number]  Shela Leff, LCSW

## 2016-06-14 NOTE — Consult Note (Signed)
   [facility] CM Inpatient Consult   06/14/2016  Kelly Porter [birth year] [id number]  Patient screened for potential [place] Management services. Patient is eligible for [facility] [place]. Electronic medical record reveals patient's discharge plan is SNF. [facility] Care Management services not appropriate at this time. If patient's post hospital needs change please place a [facility] Care Management consult. For questions please contact:   Doriann Zuch RN, [facility] Liaison  [phone]) Business Mobile [phone]) Toll free office

## 2016-06-14 NOTE — Progress Notes (Signed)
[place] at [facility]                                                                                                                                                                                            Patient Demographics   Kelly Porter, is a 82 y.o. female, DOB - 12/05/1918, [id number]  Admit date - 06/13/2016   Admitting Physician Nicholes Mango, MD  Outpatient Primary MD for the patient is GATES,ROBERT NEVILL, MD   LOS - 1  Subjective: Patient admitted with nausea vomiting and abdominal pain she still has abdominal pain. Had a bowel movement yesterday. She is not nauseous or throwing up. On further questioning with her healthcare power of attorney her knees patient has had issues with her abdomen. She does have a history of small bowel obstruction requiring surgery. Patient also has chronic anemia with drop in her hemoglobin.   Review of Systems:   CONSTITUTIONAL: No documented fever. No fatigue, weakness. No weight gain, no weight loss.  EYES: No blurry or double vision.  ENT: No tinnitus. No postnasal drip. No redness of the oropharynx.  RESPIRATORY: No cough, no wheeze, no hemoptysis. No dyspnea.  CARDIOVASCULAR: No chest pain. No orthopnea. No palpitations. No syncope.  GASTROINTESTINAL: Positive nausea, no vomiting or no diarrhea. Positive abdominal pain. No melena or hematochezia.  GENITOURINARY: No dysuria or hematuria.  ENDOCRINE: No polyuria or nocturia. No heat or cold intolerance.  HEMATOLOGY: No anemia. No bruising. No bleeding.  INTEGUMENTARY: No rashes. No lesions.  MUSCULOSKELETAL: No arthritis. No swelling. No gout.  NEUROLOGIC: No numbness, tingling, or ataxia. No seizure-type activity.  PSYCHIATRIC: No anxiety. No insomnia. No ADD.    Vitals:   Vitals:   06/13/16 1500 06/13/16 1727 06/13/16 2028 06/14/16 0453  BP: 109/60 (!) 105/44 (!) 99/41 (!) 93/42  Pulse: (!) 106 94 (!) 101 74  Resp: 19 16 17 17   Temp:  99.8  F (37.7 C) 98.4 F (36.9 C) 98.1 F (36.7 C)  TempSrc:  Oral Oral Oral  SpO2: 100%  94% 91%  Weight:  43.5 kg (95 lb 12.8 oz)    Height:  5\' 1"  (1.549 m)      Wt Readings from Last 3 Encounters:  06/13/16 43.5 kg (95 lb 12.8 oz)  05/21/15 45.8 kg (101 lb)     Intake/Output Summary (Last 24 hours) at 06/14/16 1304 Last data filed at 06/14/16 0745  Gross per 24 hour  Intake            962.5 ml  Output  500 ml  Net            462.5 ml    Physical Exam:   GENERAL: Pleasant-appearing in no apparent distress.  HEAD, EYES, EARS, NOSE AND THROAT: Atraumatic, normocephalic. Extraocular muscles are intact. Pupils equal and reactive to light. Sclerae anicteric. No conjunctival injection. No oro-pharyngeal erythema.  NECK: Supple. There is no jugular venous distention. No bruits, no lymphadenopathy, no thyromegaly.  HEART: Regular rate and rhythm,. No murmurs, no rubs, no clicks.  LUNGS: Clear to auscultation bilaterally. No rales or rhonchi. No wheezes.  ABDOMEN: Soft, flat, Positive tenderness, nondistended. Has good bowel sounds. No hepatosplenomegaly appreciated.  EXTREMITIES: No evidence of any cyanosis, clubbing, or peripheral edema.  +2 pedal and radial pulses bilaterally.  NEUROLOGIC: The patient is alert, awake, and oriented x3 with no focal motor or sensory deficits appreciated bilaterally.  SKIN: Moist and warm with no rashes appreciated.  Psych: Not anxious, depressed LN: No inguinal LN enlargement    Antibiotics   Anti-infectives    Start     Dose/Rate Route Frequency Ordered Stop   06/13/16 2230  metroNIDAZOLE (FLAGYL) IVPB 500 mg     500 mg 100 mL/hr over 60 Minutes Intravenous Every 8 hours 06/13/16 1751     06/13/16 2000  levofloxacin (LEVAQUIN) IVPB 750 mg     750 mg 100 mL/hr over 90 Minutes Intravenous Every 48 hours 06/13/16 1818     06/13/16 1430  metroNIDAZOLE (FLAGYL) IVPB 500 mg     500 mg 100 mL/hr over 60 Minutes Intravenous  Once  06/13/16 1425 06/13/16 1600      Medications   Scheduled Meds: . sodium chloride   Intravenous Once  . famotidine (PEPCID) IV  20 mg Intravenous Q12H  . levofloxacin (LEVAQUIN) IV  750 mg Intravenous Q48H  . metronidazole  500 mg Intravenous Q8H   Continuous Infusions: . dextrose 5 % and 0.9 % NaCl with KCl 20 mEq/L 75 mL/hr at 06/13/16 1946   PRN Meds:.acetaminophen **OR** acetaminophen, morphine injection, morphine injection, ondansetron **OR** ondansetron (ZOFRAN) IV   Data Review:   Micro Results Recent Results (from the past 240 hour(s))  C difficile quick scan w PCR reflex     Status: None   Collection Time: 06/13/16  8:00 PM  Result Value Ref Range Status   C Diff antigen NEGATIVE NEGATIVE Final   C Diff toxin NEGATIVE NEGATIVE Final   C Diff interpretation No C. difficile detected.  Final  Gastrointestinal Panel by PCR , Stool     Status: None   Collection Time: 06/13/16  8:00 PM  Result Value Ref Range Status   Campylobacter species NOT DETECTED NOT DETECTED Final   Plesimonas shigelloides NOT DETECTED NOT DETECTED Final   Salmonella species NOT DETECTED NOT DETECTED Final   Yersinia enterocolitica NOT DETECTED NOT DETECTED Final   Vibrio species NOT DETECTED NOT DETECTED Final   Vibrio cholerae NOT DETECTED NOT DETECTED Final   Enteroaggregative E coli (EAEC) NOT DETECTED NOT DETECTED Final   Enteropathogenic E coli (EPEC) NOT DETECTED NOT DETECTED Final   Enterotoxigenic E coli (ETEC) NOT DETECTED NOT DETECTED Final   Shiga like toxin producing E coli (STEC) NOT DETECTED NOT DETECTED Final   E. coli O157 NOT DETECTED NOT DETECTED Final   Shigella/Enteroinvasive E coli (EIEC) NOT DETECTED NOT DETECTED Final   Cryptosporidium NOT DETECTED NOT DETECTED Final   Cyclospora cayetanensis NOT DETECTED NOT DETECTED Final   Entamoeba histolytica NOT DETECTED NOT DETECTED Final  Giardia lamblia NOT DETECTED NOT DETECTED Final   Adenovirus F40/41 NOT DETECTED NOT  DETECTED Final   Astrovirus NOT DETECTED NOT DETECTED Final   Norovirus GI/GII NOT DETECTED NOT DETECTED Final   Rotavirus A NOT DETECTED NOT DETECTED Final   Sapovirus (I, II, IV, and V) NOT DETECTED NOT DETECTED Final    Radiology Reports Dg Abdomen 1 View  Result Date: 06/13/2016 CLINICAL DATA:  Vomiting, abdominal pain. EXAM: ABDOMEN - 1 VIEW COMPARISON:  CT 10/17/2008 FINDINGS: Mild gaseous distention of bowel, predominantly colon. No evidence of bowel obstruction. No free air organomegaly. Prior left hip replacement. No acute bony abnormality. IMPRESSION: Mild gaseous distention of the colon, possibly mild ileus. Electronically Signed   By: Rolm Baptise M.D.   On: 06/13/2016 12:15   Ct Abdomen Pelvis W Contrast  Result Date: 06/13/2016 CLINICAL DATA:  Vomiting. EXAM: CT ABDOMEN AND PELVIS WITH CONTRAST TECHNIQUE: Multidetector CT imaging of the abdomen and pelvis was performed using the standard protocol following bolus administration of intravenous contrast. CONTRAST:  75mL ISOVUE-300 IOPAMIDOL (ISOVUE-300) INJECTION 61% COMPARISON:  None FINDINGS: Lower chest: Small bilateral pleural effusions are identified right greater than left. Thoracic aortic calcifications noted. There also calcifications within the RCA, LAD and left circumflex coronary artery. Hepatobiliary: For no focal liver abnormalities. The gallbladder is unremarkable. No biliary dilatation. Pancreas: No mass, inflammatory changes, or other significant abnormality. Spleen: Within normal limits in size and appearance. Adrenals/Urinary Tract: Normal adrenal glands. The kidneys are unremarkable. No mass or obstructive uropathy. The urinary bladder appears normal. Stomach/Bowel: Small hiatal hernia. The small bowel loops are increased in caliber measuring up to 2.7 cm. There is abnormal scratch set there is marked wall thickening involving the distal small bowel loops which measure up to 1.5 cm, image 63 of series 2. Dilute contrast is  identified within the proximal colon. Extensive diverticular disease involves the descending colon and sigmoid colon. No evidence for diverticulitis. Vascular/Lymphatic: Aortic atherosclerosis noted. No aneurysm. No enlarged abdominal or pelvic adenopathy. Reproductive: No mass or other significant abnormality. Other: There is a small to moderate amount of ascites identified within the upper abdomen and pelvis. No free air identified. Musculoskeletal: Degenerative disc disease is identified within the lumbar spine. No suspicious bone lesions identified. IMPRESSION: 1. Examination is positive for abnormal bowel wall thickening involving the distal small bowel loops compatible with no evidence for perforation or abscess. 2. Abnormal small bowel dilatation which may reflect ileus or partial obstruction secondary to distal small bowel inflammation. 3. Abdominal and pelvic ascites.  No abscess identified. 4. Aortic atherosclerosis. 5. Small hiatal hernia. 6. Small pleural effusions. Electronically Signed   By: Kerby Moors M.D.   On: 06/13/2016 14:17     CBC  Recent Labs Lab 06/13/16 1144 06/14/16 0506  WBC 5.6 7.0  HGB 10.7* 7.6*  HCT 32.4* 22.6*  PLT 310 205  MCV 88.2 87.1  MCH 29.1 29.1  MCHC 33.0 33.4  RDW 14.2 14.1    Chemistries   Recent Labs Lab 06/13/16 1144 06/14/16 0506  NA 133* 134*  K 4.7 4.3  CL 98* 103  CO2 25 26  GLUCOSE 163* 122*  BUN 42* 39*  CREATININE 0.93 0.90  CALCIUM 8.2* 7.0*  AST 32 26  ALT 12* 11*  ALKPHOS 62 49  BILITOT 0.2* 0.3   ------------------------------------------------------------------------------------------------------------------ estimated creatinine clearance is 25.7 mL/min (by C-G formula based on SCr of 0.9 mg/dL). ------------------------------------------------------------------------------------------------------------------ No results for input(s): HGBA1C in the last 72  hours. ------------------------------------------------------------------------------------------------------------------  No results for input(s): CHOL, HDL, LDLCALC, TRIG, CHOLHDL, LDLDIRECT in the last 72 hours. ------------------------------------------------------------------------------------------------------------------ No results for input(s): TSH, T4TOTAL, T3FREE, THYROIDAB in the last 72 hours.  Invalid input(s): FREET3 ------------------------------------------------------------------------------------------------------------------ No results for input(s): VITAMINB12, FOLATE, FERRITIN, TIBC, IRON, RETICCTPCT in the last 72 hours.  Coagulation profile No results for input(s): INR, PROTIME in the last 168 hours.  No results for input(s): DDIMER in the last 72 hours.  Cardiac Enzymes No results for input(s): CKMB, TROPONINI, MYOGLOBIN in the last 168 hours.  Invalid input(s): CK ------------------------------------------------------------------------------------------------------------------ Invalid input(s): POCBNP    Assessment & Plan   Kelly Porter  is a 82 y.o. female with a known history of Chronic low back pain, chronic anemia on iron tablets, chronic anxiety, lives alone, DO NOT RESUSCITATE was in her usual state of health until last night. Patient started complaining of lower abdominal pain mainly in the left lower quadrant and was experiencing nausea and vomiting persistently. She had one bowel movement in the ED which was dark and tarry looking but not diarrhea.  #1 partial small bowel obstruction Continue supportive care patient had bowel movement yesterday CT suggestive of colitis Continue IV antibiotics Patient family not interested in surgery  #2 symptomatic anemia Patient's blood pressure low heart rate elevated I will add iron studies to her blood Go ahead and transfuse the patient discussed with her and her niece and consented for transfusion Likely has  chronic slow GI bleed   #3 chronic low back pain Continue when necessary pain medications   #4 miscellaneous SCDs for DVT prophylaxis in light of her hemoglobin dropping quite positive stools     Code Status Orders        Start     Ordered   06/13/16 1751  Do not attempt resuscitation (DNR)  Continuous    Question Answer Comment  In the event of cardiac or respiratory ARREST Do not call a "code blue"   In the event of cardiac or respiratory ARREST Do not perform Intubation, CPR, defibrillation or ACLS   In the event of cardiac or respiratory ARREST Use medication by any route, position, wound care, and other measures to relive pain and suffering. May use oxygen, suction and manual treatment of airway obstruction as needed for comfort.   Comments RN may pronounce      06/13/16 1751    Code Status History    Date Active Date Inactive Code Status Order ID Comments User Context   06/13/2016  2:54 PM 06/13/2016  5:51 PM DNR [id number]  Merlyn Lot, MD ED   05/20/2015  3:48 AM 05/22/2015  6:48 PM DNR [id number]  Corky Sox, MD Inpatient   05/19/2015 10:14 PM 05/20/2015  3:48 AM Full Code [id number]  Corky Sox, MD Inpatient    Advance Directive Documentation   Flowsheet Row Most Recent Value  Type of Advance Directive  Healthcare Power of Attorney  Pre-existing out of facility DNR order (yellow form or pink MOST form)  No data  "MOST" Form in Place?  No data           Consults  None   DVT ProphylaxisSCDs   Lab Results  Component Value Date   PLT 205 06/14/2016     Time Spent in minutes  66min Greater than 50% of time spent in care coordination and counseling patient regarding the condition and plan of care.   Dustin Flock M.D on 06/14/2016 at 1:04 PM  Between 7am to 6pm - Pager - [phone]  After 6pm go to [email or web address] - password [email or web address] [place] [facility]   Office  [phone]

## 2016-06-14 NOTE — Plan of Care (Signed)
Problem: Activity: Goal: Risk for activity intolerance will decrease Outcome: Progressing PT is still weak, but is getting up to the [facility] with assistance.

## 2016-06-15 ENCOUNTER — Inpatient Hospital Stay: Payer: Medicare Other

## 2016-06-15 LAB — CBC
HCT: 28 % — ABNORMAL LOW (ref 35.0–47.0)
Hemoglobin: 9.6 g/dL — ABNORMAL LOW (ref 12.0–16.0)
MCH: 29.8 pg (ref 26.0–34.0)
MCHC: 34.3 g/dL (ref 32.0–36.0)
MCV: 86.8 fL (ref 80.0–100.0)
PLATELETS: 187 10*3/uL (ref 150–440)
RBC: 3.23 MIL/uL — AB (ref 3.80–5.20)
RDW: 14.3 % (ref 11.5–14.5)
WBC: 10.7 10*3/uL (ref 3.6–11.0)

## 2016-06-15 LAB — BASIC METABOLIC PANEL
ANION GAP: 5 (ref 5–15)
BUN: 28 mg/dL — ABNORMAL HIGH (ref 6–20)
CALCIUM: 7 mg/dL — AB (ref 8.9–10.3)
CO2: 23 mmol/L (ref 22–32)
Chloride: 108 mmol/L (ref 101–111)
Creatinine, Ser: 0.77 mg/dL (ref 0.44–1.00)
GFR calc Af Amer: >60 mL/min (ref >60)
GLUCOSE: 140 mg/dL — AB (ref 65–99)
Potassium: 4 mmol/L (ref 3.5–5.1)
SODIUM: 136 mmol/L (ref 135–145)

## 2016-06-15 LAB — TYPE AND SCREEN
ABO/RH(D): O POS
ANTIBODY SCREEN: NEGATIVE
UNIT DIVISION: 0

## 2016-06-15 MED ORDER — FERROUS SULFATE 325 (65 FE) MG PO TABS
325.0000 mg | ORAL_TABLET | Freq: Every day | ORAL | Status: DC
  Administered 2016-06-15 – 2016-06-17 (×3): 325 mg via ORAL
  Filled 2016-06-15 (×3): qty 1

## 2016-06-15 MED ORDER — CIPROFLOXACIN HCL 500 MG PO TABS
500.0000 mg | ORAL_TABLET | Freq: Two times a day (BID) | ORAL | Status: DC
  Administered 2016-06-15 – 2016-06-17 (×5): 500 mg via ORAL
  Filled 2016-06-15 (×5): qty 1

## 2016-06-15 MED ORDER — METRONIDAZOLE 500 MG PO TABS
500.0000 mg | ORAL_TABLET | Freq: Three times a day (TID) | ORAL | Status: DC
  Administered 2016-06-15 – 2016-06-17 (×7): 500 mg via ORAL
  Filled 2016-06-15 (×7): qty 1

## 2016-06-15 MED ORDER — POLYETHYLENE GLYCOL 3350 17 G PO PACK
17.0000 g | PACK | Freq: Every day | ORAL | Status: DC
  Administered 2016-06-15 – 2016-06-17 (×3): 17 g via ORAL
  Filled 2016-06-15 (×3): qty 1

## 2016-06-15 NOTE — Evaluation (Signed)
Physical Therapy Evaluation Patient Details Name: Kelly Porter MRN: [id number] DOB: 06/27/1919 Today's Date: 06/15/2016   History of Present Illness  Pt is a 82 y.o. female with PMH including small hiatal hernia, anxiety, PNA, chronic LBP, and anemia.  Pt presenting to hospital with 4 days severe diffuse abdominal pain with contipation and N/V.  Pt admitted with ileus.  Clinical Impression  Prior to admission, pt reports being modified independent with functional mobility using 2 walking canes/sticks to ambulate with.  Pt reports living alone in 1 level home with stairs to enter and has family assist/check in's.  Currently pt is CGA with transfers and ambulation around nursing loop with RW.  Pt demonstrating some fatigue with distance but overall steady with use of RW and balance activities performed (see note below).  Pt would benefit from skilled PT to address noted impairments and functional limitations.  Recommend pt discharge to home with intermittent supervision/assist, HHPT, and use of youth sized RW for ambulation when medically appropriate.     Follow Up Recommendations Home health PT;Supervision - Intermittent    Equipment Recommendations   (youth sized RW)    Recommendations for Other Services       Precautions / Restrictions Precautions Precautions: Fall Restrictions Weight Bearing Restrictions: No      Mobility  Bed Mobility               General bed mobility comments: Deferred d/t pt sitting in chair beginning and end of session  Transfers Overall transfer level: Needs assistance Equipment used: Rolling walker (2 wheeled) Transfers: Sit to/from Stand (x2 trials) Sit to Stand: Min guard         General transfer comment: steady without loss of balance  Ambulation/Gait Ambulation/Gait assistance: Min guard Ambulation Distance (Feet): 200 Feet Assistive device: Rolling walker (2 wheeled) Gait Pattern/deviations: Step-through pattern Gait velocity: mild  decreased speed   General Gait Details: steady without loss of balance; able to navigate with RW without assist  Stairs            Wheelchair Mobility    Modified Rankin (Stroke Patients Only)       Balance Overall balance assessment: Needs assistance Sitting-balance support: No upper extremity supported;Feet supported Sitting balance-Leahy Scale: Normal     Standing balance support: Single extremity supported Standing balance-Leahy Scale: Good Standing balance comment: Pt able to reach inside and outside of BOS with one UE (other UE on RW); pt stood on 1 foot (B UE's on walker) for PT to adjust her sock in standing; pt stood x10 seconds with NBOS and no UE support without loss of balance                             Pertinent Vitals/Pain Pain Assessment: No/denies pain  Vitals stable and WFL throughout treatment session on RA.    Home Living Family/patient expects to be discharged to:: Private residence Living Arrangements: Alone Available Help at Discharge: Family Type of Home: House Home Access: Stairs to enter   [employer] of Steps: 2 plus 1 step with B railing Home Layout: One level (with basement) Home Equipment: Grab bars - toilet;Grab bars - tub/shower;Tub bench (2 walking canes and 2 walking sticks)      Prior Function Level of Independence: Independent with assistive device(s)         Comments: Pt reports using walking canes vs sticks to ambulate with all of the time; pt denies any  falls in past 6 months; pt reports family checks on pt and helps with shopping     Hand Dominance        Extremity/Trunk Assessment   Upper Extremity Assessment: Generalized weakness           Lower Extremity Assessment: Generalized weakness         Communication   Communication: HOH  Cognition Arousal/Alertness: Awake/alert Behavior During Therapy: WFL for tasks assessed/performed Overall Cognitive Status: Within Functional Limits  for tasks assessed                      General Comments General comments (skin integrity, edema, etc.): Pt sitting in chair just finishing breakfast.  Nursing cleared pt for participation in physical therapy.  Pt agreeable to PT session.    Exercises Total Joint Exercises Ankle Circles/Pumps: AROM;Strengthening;Both;10 reps;Seated Towel Squeeze: AROM;Strengthening;Both;10 reps;Seated (pillow between pt's knees to squeeze) Long Arc Quad: AROM;Strengthening;Both;10 reps;Seated General Exercises - Lower Extremity Hip Flexion/Marching: AROM;Strengthening;Both;10 reps;Seated      Assessment/Plan    PT Assessment Patient needs continued PT services  PT Diagnosis Generalized weakness;Difficulty walking   PT Problem List Decreased activity tolerance;Decreased balance;Decreased mobility  PT Treatment Interventions DME instruction;Gait training;Stair training;Functional mobility training;Therapeutic activities;Therapeutic exercise;Balance training;Patient/family education   PT Goals (Current goals can be found in the Care Plan section) Acute Rehab PT Goals Patient Stated Goal: to go home PT Goal Formulation: With patient Time For Goal Achievement: 06/29/16 Potential to Achieve Goals: Good    Frequency Min 2X/week   Barriers to discharge        Co-evaluation               End of Session Equipment Utilized During Treatment: Gait belt Activity Tolerance: Patient tolerated treatment well Patient left: in chair;with call bell/phone within reach;with chair alarm set Nurse Communication: Mobility status;Precautions         Time: 0900-0930 PT Time Calculation (min) (ACUTE ONLY): 30 min   Charges:   PT Evaluation $PT Eval Low Complexity: 1 Procedure PT Treatments $Therapeutic Exercise: 8-22 mins   PT G CodesLeitha Bleak July 07, 2016, 9:49 AM Leitha Bleak, [place]

## 2016-06-15 NOTE — Care Management (Signed)
PT has assessed patient and recommend home health PT.  Patient lives at home alone.  Nieces live locally for support.  Patient obtains her medications from [facility] in [place].  Niece Bethena Roys states that patient has been open with a home health care agency in the past.  She is going to try to determine who the agency was and call me back.  Plan for patient to discharge home with home health with appropriate. RNCM following.

## 2016-06-15 NOTE — Care Management Important Message (Signed)
Important Message  Patient Details  Name: Kelly Porter MRN: [id number] Date of Birth: [birth year]   Medicare Important Message Given:  Yes    Beverly Sessions, RN 06/15/2016, 3:32 PM

## 2016-06-15 NOTE — Clinical Social Work Note (Deleted)
MSW received referral for SNF.  Case discussed with case manager and plan is to discharge home with home health.  MSW to sign off please re-consult if social work needs arise.  Kelly Porter R. Jasmeet Gehl, MSW Mon-Fri 8a-4:30p [phone]   

## 2016-06-15 NOTE — Progress Notes (Signed)
[facility]                                                                                                                                                                                            Patient Demographics   Kelly Porter, is a 82 y.o. female, DOB - Nov 19, 1918, [id number]  Admit date - 06/13/2016   Admitting Physician Nicholes Mango, MD  Outpatient Primary MD for the patient is GATES,ROBERT NEVILL, MD   LOS - 2  Subjective: Patient had 2 bowel movements yesterday. Hemoglobin stable. She is denying any abdominal cramping. Was able to ambulate with physical therapy.   Review of Systems:   CONSTITUTIONAL: No documented fever. No fatigue, weakness. No weight gain, no weight loss.  EYES: No blurry or double vision.  ENT: No tinnitus. No postnasal drip. No redness of the oropharynx.  RESPIRATORY: No cough, no wheeze, no hemoptysis. No dyspnea.  CARDIOVASCULAR: No chest pain. No orthopnea. No palpitations. No syncope.  GASTROINTESTINAL: Positive nausea, no vomiting or no diarrhea. Resolved No melena or hematochezia.  GENITOURINARY: No dysuria or hematuria.  ENDOCRINE: No polyuria or nocturia. No heat or cold intolerance.  HEMATOLOGY: No anemia. No bruising. No bleeding.  INTEGUMENTARY: No rashes. No lesions.  MUSCULOSKELETAL: No arthritis. No swelling. No gout.  NEUROLOGIC: No numbness, tingling, or ataxia. No seizure-type activity.  PSYCHIATRIC: No anxiety. No insomnia. No ADD.    Vitals:   Vitals:   06/14/16 2059 06/15/16 0457 06/15/16 0930 06/15/16 1000  BP: (!) 91/53 (!) 122/55  118/71  Pulse: 97 76 96 91  Resp: 17 18    Temp: 99.3 F (37.4 C) 98.5 F (36.9 C)  99.6 F (37.6 C)  TempSrc: Oral Oral  Oral  SpO2: 97% 94% 97% 97%  Weight:      Height:        Wt Readings from Last 3 Encounters:  06/13/16 43.5 kg (95 lb 12.8 oz)  05/21/15 45.8 kg (101 lb)     Intake/Output Summary (Last 24 hours) at 06/15/16  1244 Last data filed at 06/15/16 0829  Gross per 24 hour  Intake             2531 ml  Output              550 ml  Net             1981 ml    Physical Exam:   GENERAL: Pleasant-appearing in no apparent distress.  HEAD, EYES, EARS, NOSE AND THROAT: Atraumatic, normocephalic. Extraocular muscles are intact. Pupils equal and reactive to light. Sclerae anicteric. No  conjunctival injection. No oro-pharyngeal erythema.  NECK: Supple. There is no jugular venous distention. No bruits, no lymphadenopathy, no thyromegaly.  HEART: Regular rate and rhythm,. No murmurs, no rubs, no clicks.  LUNGS: Clear to auscultation bilaterally. No rales or rhonchi. No wheezes.  ABDOMEN: Soft, flat, Positive tenderness, nondistended. Has good bowel sounds. No hepatosplenomegaly appreciated.  EXTREMITIES: No evidence of any cyanosis, clubbing, or peripheral edema.  +2 pedal and radial pulses bilaterally.  NEUROLOGIC: The patient is alert, awake, and oriented x3 with no focal motor or sensory deficits appreciated bilaterally.  SKIN: Moist and warm with no rashes appreciated.  Psych: Not anxious, depressed LN: No inguinal LN enlargement    Antibiotics   Anti-infectives    Start     Dose/Rate Route Frequency Ordered Stop   06/15/16 1400  metroNIDAZOLE (FLAGYL) tablet 500 mg     500 mg Oral Every 8 hours 06/15/16 1106     06/15/16 1200  ciprofloxacin (CIPRO) tablet 500 mg     500 mg Oral 2 times daily 06/15/16 1106     06/13/16 2230  metroNIDAZOLE (FLAGYL) IVPB 500 mg  Status:  Discontinued     500 mg 100 mL/hr over 60 Minutes Intravenous Every 8 hours 06/13/16 1751 06/15/16 1105   06/13/16 2000  levofloxacin (LEVAQUIN) IVPB 750 mg  Status:  Discontinued     750 mg 100 mL/hr over 90 Minutes Intravenous Every 48 hours 06/13/16 1818 06/15/16 1105   06/13/16 1430  metroNIDAZOLE (FLAGYL) IVPB 500 mg     500 mg 100 mL/hr over 60 Minutes Intravenous  Once 06/13/16 1425 06/13/16 1600      Medications    Scheduled Meds: . ciprofloxacin  500 mg Oral BID  . metroNIDAZOLE  500 mg Oral Q8H  . pantoprazole (PROTONIX) IV  40 mg Intravenous Q12H  . polyethylene glycol  17 g Oral Daily   Continuous Infusions:   PRN Meds:.acetaminophen **OR** acetaminophen, morphine injection, morphine injection, ondansetron **OR** ondansetron (ZOFRAN) IV   Data Review:   Micro Results Recent Results (from the past 240 hour(s))  C difficile quick scan w PCR reflex     Status: None   Collection Time: 06/13/16  8:00 PM  Result Value Ref Range Status   C Diff antigen NEGATIVE NEGATIVE Final   C Diff toxin NEGATIVE NEGATIVE Final   C Diff interpretation No C. difficile detected.  Final  Gastrointestinal Panel by PCR , Stool     Status: None   Collection Time: 06/13/16  8:00 PM  Result Value Ref Range Status   Campylobacter species NOT DETECTED NOT DETECTED Final   Plesimonas shigelloides NOT DETECTED NOT DETECTED Final   Salmonella species NOT DETECTED NOT DETECTED Final   Yersinia enterocolitica NOT DETECTED NOT DETECTED Final   Vibrio species NOT DETECTED NOT DETECTED Final   Vibrio cholerae NOT DETECTED NOT DETECTED Final   Enteroaggregative E coli (EAEC) NOT DETECTED NOT DETECTED Final   Enteropathogenic E coli (EPEC) NOT DETECTED NOT DETECTED Final   Enterotoxigenic E coli (ETEC) NOT DETECTED NOT DETECTED Final   Shiga like toxin producing E coli (STEC) NOT DETECTED NOT DETECTED Final   E. coli O157 NOT DETECTED NOT DETECTED Final   Shigella/Enteroinvasive E coli (EIEC) NOT DETECTED NOT DETECTED Final   Cryptosporidium NOT DETECTED NOT DETECTED Final   Cyclospora cayetanensis NOT DETECTED NOT DETECTED Final   Entamoeba histolytica NOT DETECTED NOT DETECTED Final   Giardia lamblia NOT DETECTED NOT DETECTED Final   Adenovirus F40/41 NOT DETECTED  NOT DETECTED Final   Astrovirus NOT DETECTED NOT DETECTED Final   Norovirus GI/GII NOT DETECTED NOT DETECTED Final   Rotavirus A NOT DETECTED NOT  DETECTED Final   Sapovirus (I, II, IV, and V) NOT DETECTED NOT DETECTED Final    Radiology Reports Dg Abdomen 1 View  Result Date: 06/13/2016 CLINICAL DATA:  Vomiting, abdominal pain. EXAM: ABDOMEN - 1 VIEW COMPARISON:  CT 10/17/2008 FINDINGS: Mild gaseous distention of bowel, predominantly colon. No evidence of bowel obstruction. No free air organomegaly. Prior left hip replacement. No acute bony abnormality. IMPRESSION: Mild gaseous distention of the colon, possibly mild ileus. Electronically Signed   By: Rolm Baptise M.D.   On: 06/13/2016 12:15   Ct Abdomen Pelvis W Contrast  Result Date: 06/13/2016 CLINICAL DATA:  Vomiting. EXAM: CT ABDOMEN AND PELVIS WITH CONTRAST TECHNIQUE: Multidetector CT imaging of the abdomen and pelvis was performed using the standard protocol following bolus administration of intravenous contrast. CONTRAST:  27mL ISOVUE-300 IOPAMIDOL (ISOVUE-300) INJECTION 61% COMPARISON:  None FINDINGS: Lower chest: Small bilateral pleural effusions are identified right greater than left. Thoracic aortic calcifications noted. There also calcifications within the RCA, LAD and left circumflex coronary artery. Hepatobiliary: For no focal liver abnormalities. The gallbladder is unremarkable. No biliary dilatation. Pancreas: No mass, inflammatory changes, or other significant abnormality. Spleen: Within normal limits in size and appearance. Adrenals/Urinary Tract: Normal adrenal glands. The kidneys are unremarkable. No mass or obstructive uropathy. The urinary bladder appears normal. Stomach/Bowel: Small hiatal hernia. The small bowel loops are increased in caliber measuring up to 2.7 cm. There is abnormal scratch set there is marked wall thickening involving the distal small bowel loops which measure up to 1.5 cm, image 63 of series 2. Dilute contrast is identified within the proximal colon. Extensive diverticular disease involves the descending colon and sigmoid colon. No evidence for  diverticulitis. Vascular/Lymphatic: Aortic atherosclerosis noted. No aneurysm. No enlarged abdominal or pelvic adenopathy. Reproductive: No mass or other significant abnormality. Other: There is a small to moderate amount of ascites identified within the upper abdomen and pelvis. No free air identified. Musculoskeletal: Degenerative disc disease is identified within the lumbar spine. No suspicious bone lesions identified. IMPRESSION: 1. Examination is positive for abnormal bowel wall thickening involving the distal small bowel loops compatible with no evidence for perforation or abscess. 2. Abnormal small bowel dilatation which may reflect ileus or partial obstruction secondary to distal small bowel inflammation. 3. Abdominal and pelvic ascites.  No abscess identified. 4. Aortic atherosclerosis. 5. Small hiatal hernia. 6. Small pleural effusions. Electronically Signed   By: Kerby Moors M.D.   On: 06/13/2016 14:17     CBC  Recent Labs Lab 06/13/16 1144 06/14/16 0506 06/14/16 1902 06/15/16 0507  WBC 5.6 7.0  --  10.7  HGB 10.7* 7.6* 9.5* 9.6*  HCT 32.4* 22.6* 28.4* 28.0*  PLT 310 205  --  187  MCV 88.2 87.1  --  86.8  MCH 29.1 29.1  --  29.8  MCHC 33.0 33.4  --  34.3  RDW 14.2 14.1  --  14.3    Chemistries   Recent Labs Lab 06/13/16 1144 06/14/16 0506 06/15/16 0507  NA 133* 134* 136  K 4.7 4.3 4.0  CL 98* 103 108  CO2 25 26 23   GLUCOSE 163* 122* 140*  BUN 42* 39* 28*  CREATININE 0.93 0.90 0.77  CALCIUM 8.2* 7.0* 7.0*  AST 32 26  --   ALT 12* 11*  --   ALKPHOS 62 49  --  BILITOT 0.2* 0.3  --    ------------------------------------------------------------------------------------------------------------------ estimated creatinine clearance is 28.9 mL/min (by C-G formula based on SCr of 0.8 mg/dL). ------------------------------------------------------------------------------------------------------------------ No results for input(s): HGBA1C in the last 72  hours. ------------------------------------------------------------------------------------------------------------------ No results for input(s): CHOL, HDL, LDLCALC, TRIG, CHOLHDL, LDLDIRECT in the last 72 hours. ------------------------------------------------------------------------------------------------------------------ No results for input(s): TSH, T4TOTAL, T3FREE, THYROIDAB in the last 72 hours.  Invalid input(s): FREET3 ------------------------------------------------------------------------------------------------------------------  Recent Labs  06/14/16 0506  FERRITIN 63  TIBC 194*  IRON <5*    Coagulation profile No results for input(s): INR, PROTIME in the last 168 hours.  No results for input(s): DDIMER in the last 72 hours.  Cardiac Enzymes No results for input(s): CKMB, TROPONINI, MYOGLOBIN in the last 168 hours.  Invalid input(s): CK ------------------------------------------------------------------------------------------------------------------ Invalid input(s): POCBNP    Assessment & Plan   Bonesteel  is a 82 y.o. female with a known history of Chronic low back pain, chronic anemia on iron tablets, chronic anxiety, lives alone, DO NOT RESUSCITATE was in her usual state of health until last night. Patient started complaining of lower abdominal pain mainly in the left lower quadrant and was experiencing nausea and vomiting persistently. She had one bowel movement in the ED which was dark and tarry looking but not diarrhea.  #1 partial small bowel obstruction Continue supportive care patient  Symptoms improved  CT suggestive of colitis Continue IV antibiotics Patient family not interested in surgery Repeat abdominal x-ray  #2 symptomatic anemia Status post transfusion hemoglobin stable   #3 chronic low back pain Continue when necessary pain medications   #4 miscellaneous SCDs for DVT prophylaxis in light of her hemoglobin dropping quite positive  stools     Code Status Orders        Start     Ordered   06/13/16 1751  Do not attempt resuscitation (DNR)  Continuous    Question Answer Comment  In the event of cardiac or respiratory ARREST Do not call a "code blue"   In the event of cardiac or respiratory ARREST Do not perform Intubation, CPR, defibrillation or ACLS   In the event of cardiac or respiratory ARREST Use medication by any route, position, wound care, and other measures to relive pain and suffering. May use oxygen, suction and manual treatment of airway obstruction as needed for comfort.   Comments RN may pronounce      06/13/16 1751    Code Status History    Date Active Date Inactive Code Status Order ID Comments User Context   06/13/2016  2:54 PM 06/13/2016  5:51 PM DNR [id number]  Merlyn Lot, MD ED   05/20/2015  3:48 AM 05/22/2015  6:48 PM DNR [id number]  Corky Sox, MD Inpatient   05/19/2015 10:14 PM 05/20/2015  3:48 AM Full Code [id number]  Corky Sox, MD Inpatient    Advance Directive Documentation   Flowsheet Row Most Recent Value  Type of Advance Directive  Healthcare Power of Attorney  Pre-existing out of facility DNR order (yellow form or pink MOST form)  No data  "MOST" Form in Place?  No data           Consults  None   DVT ProphylaxisSCDs   Lab Results  Component Value Date   PLT 187 06/15/2016     Time Spent in minutes  71min Greater than 50% of time spent in care coordination and counseling patient regarding the condition and plan of care.   Dustin Flock M.D on 06/15/2016 at 12:44  PM  Between 7am to 6pm - Pager - [phone]  After 6pm go to [email or web address] - password [email or web address] [facility]   Office  [phone]

## 2016-06-16 LAB — BASIC METABOLIC PANEL
Anion gap: 5 (ref 5–15)
BUN: 25 mg/dL — AB (ref 6–20)
CHLORIDE: 107 mmol/L (ref 101–111)
CO2: 24 mmol/L (ref 22–32)
Calcium: 7.2 mg/dL — ABNORMAL LOW (ref 8.9–10.3)
Creatinine, Ser: 0.81 mg/dL (ref 0.44–1.00)
GFR calc Af Amer: >60 mL/min (ref >60)
GFR calc non Af Amer: 60 mL/min — ABNORMAL LOW (ref >60)
GLUCOSE: 122 mg/dL — AB (ref 65–99)
POTASSIUM: 3.9 mmol/L (ref 3.5–5.1)
Sodium: 136 mmol/L (ref 135–145)

## 2016-06-16 LAB — CBC
HEMATOCRIT: 27.4 % — AB (ref 35.0–47.0)
HEMOGLOBIN: 9.2 g/dL — AB (ref 12.0–16.0)
MCH: 29.1 pg (ref 26.0–34.0)
MCHC: 33.5 g/dL (ref 32.0–36.0)
MCV: 86.7 fL (ref 80.0–100.0)
Platelets: 207 10*3/uL (ref 150–440)
RBC: 3.16 MIL/uL — AB (ref 3.80–5.20)
RDW: 14.4 % (ref 11.5–14.5)
WBC: 11.3 10*3/uL — ABNORMAL HIGH (ref 3.6–11.0)

## 2016-06-16 MED ORDER — FAMOTIDINE 20 MG PO TABS
20.0000 mg | ORAL_TABLET | Freq: Every day | ORAL | Status: DC
  Administered 2016-06-17: 20 mg via ORAL
  Filled 2016-06-16: qty 1

## 2016-06-16 MED ORDER — METOPROLOL TARTRATE 25 MG PO TABS
25.0000 mg | ORAL_TABLET | Freq: Two times a day (BID) | ORAL | Status: DC
  Administered 2016-06-16 – 2016-06-17 (×2): 25 mg via ORAL
  Filled 2016-06-16 (×2): qty 1

## 2016-06-16 MED ORDER — SIMETHICONE 80 MG PO CHEW
80.0000 mg | CHEWABLE_TABLET | Freq: Four times a day (QID) | ORAL | Status: DC
  Administered 2016-06-16 – 2016-06-17 (×6): 80 mg via ORAL
  Filled 2016-06-16 (×7): qty 1

## 2016-06-16 MED ORDER — SODIUM CHLORIDE 0.9 % IV SOLN
200.0000 mg | Freq: Once | INTRAVENOUS | Status: AC
  Administered 2016-06-16: 200 mg via INTRAVENOUS
  Filled 2016-06-16: qty 10

## 2016-06-16 MED ORDER — FAMOTIDINE IN NACL 20-0.9 MG/50ML-% IV SOLN
20.0000 mg | Freq: Two times a day (BID) | INTRAVENOUS | Status: DC
  Administered 2016-06-16: 20 mg via INTRAVENOUS
  Filled 2016-06-16 (×2): qty 50

## 2016-06-16 NOTE — Progress Notes (Addendum)
Key Points: Use following P&T approved IV to PO antibiotic change policy.  Description contains the criteria that are approved Note: Policy Excludes:  Esophagectomy patientsPHARMACIST - PHYSICIAN COMMUNICATION DR:   Dustin Flock CONCERNING: IV to Oral Route Change Policy  RECOMMENDATION: This patient is receiving famotidine by the intravenous route.  Based on criteria approved by the Pharmacy and Therapeutics Committee, the intravenous medication(s) is/are being converted to the equivalent oral dose form(s).   DESCRIPTION: These criteria include:  The patient is eating (either orally or via tube) and/or has been taking other orally administered medications for a least 24 hours  The patient has no evidence of active gastrointestinal bleeding or impaired GI absorption (gastrectomy, short bowel, patient on TNA or NPO).  If you have questions about this conversion, please contact the Pharmacy Department   Plan: Patient will receive famotidine 20mg  PO daily due to renal function.   [place], [facility] 06/16/2016 12:58 PM

## 2016-06-16 NOTE — Clinical Social Work Note (Addendum)
MSW was informed that patient's family would like her to go to [facility].  MSW contacted [facility] and left a message on admissions voice mail.  MSW to continue to follow patient's progress throughout discharge planning.  3:00pm  MSW spoke to admissions worker at [facility] who said they can accept patient tomorrow if she is medically ready for discharge and orders have been received.  MSW spoke to patient's niece Fonnie Birkenhead [phone] and informed her that [facility] is able to accept patient once she is ready for discharge.  Jones Broom. Daxtyn Rottenberg, MSW [phone]  Mon-Fri 8a-4:30p 06/16/2016 2:10 PM

## 2016-06-16 NOTE — Progress Notes (Signed)
[place] at [facility]                                                                                                                                                                                            Patient Demographics   Kelly Porter, is a 82 y.o. female, DOB - January 13, 1919, [id number]  Admit date - 06/13/2016   Admitting Physician Nicholes Mango, MD  Outpatient Primary MD for the patient is GATES,ROBERT NEVILL, MD   LOS - 3  Subjective: Patient had 2 bowel movements yesterday. Hemoglobin stable. She is denying any abdominal cramping. Was able to ambulate with physical therapy.   Review of Systems:   CONSTITUTIONAL: No documented fever. No fatigue, weakness. No weight gain, no weight loss.  EYES: No blurry or double vision.  ENT: No tinnitus. No postnasal drip. No redness of the oropharynx.  RESPIRATORY: No cough, no wheeze, no hemoptysis. No dyspnea.  CARDIOVASCULAR: No chest pain. No orthopnea. No palpitations. No syncope.  GASTROINTESTINAL: Positive nausea, no vomiting or no diarrhea. Resolved No melena or hematochezia.  GENITOURINARY: No dysuria or hematuria.  ENDOCRINE: No polyuria or nocturia. No heat or cold intolerance.  HEMATOLOGY: No anemia. No bruising. No bleeding.  INTEGUMENTARY: No rashes. No lesions.  MUSCULOSKELETAL: No arthritis. No swelling. No gout.  NEUROLOGIC: No numbness, tingling, or ataxia. No seizure-type activity.  PSYCHIATRIC: No anxiety. No insomnia. No ADD.    Vitals:   Vitals:   06/16/16 0538 06/16/16 0826 06/16/16 1000 06/16/16 1225  BP:  132/65  118/65  Pulse:  98 (!) 109 90  Resp:  (!) 23  16  Temp: 97.9 F (36.6 C) 98.1 F (36.7 C)  98.5 F (36.9 C)  TempSrc: Oral Oral  Oral  SpO2:  93% 93% 96%  Weight:      Height:        Wt Readings from Last 3 Encounters:  06/13/16 43.5 kg (95 lb 12.8 oz)  05/21/15 45.8 kg (101 lb)     Intake/Output Summary (Last 24 hours) at 06/16/16 1316 Last  data filed at 06/16/16 0900  Gross per 24 hour  Intake              360 ml  Output              350 ml  Net               10 ml    Physical Exam:   GENERAL: Pleasant-appearing in no apparent distress.  HEAD, EYES, EARS, NOSE AND THROAT: Atraumatic, normocephalic. Extraocular muscles are intact. Pupils equal and reactive to light.  Sclerae anicteric. No conjunctival injection. No oro-pharyngeal erythema.  NECK: Supple. There is no jugular venous distention. No bruits, no lymphadenopathy, no thyromegaly.  HEART: Regular rate and rhythm,. No murmurs, no rubs, no clicks.  LUNGS: Clear to auscultation bilaterally. No rales or rhonchi. No wheezes.  ABDOMEN: Soft, flat, Positive tenderness, nondistended. Has good bowel sounds. No hepatosplenomegaly appreciated.  EXTREMITIES: No evidence of any cyanosis, clubbing, or peripheral edema.  +2 pedal and radial pulses bilaterally.  NEUROLOGIC: The patient is alert, awake, and oriented x3 with no focal motor or sensory deficits appreciated bilaterally.  SKIN: Moist and warm with no rashes appreciated.  Psych: Not anxious, depressed LN: No inguinal LN enlargement    Antibiotics   Anti-infectives    Start     Dose/Rate Route Frequency Ordered Stop   06/15/16 1400  metroNIDAZOLE (FLAGYL) tablet 500 mg     500 mg Oral Every 8 hours 06/15/16 1106     06/15/16 1200  ciprofloxacin (CIPRO) tablet 500 mg     500 mg Oral 2 times daily 06/15/16 1106     06/13/16 2230  metroNIDAZOLE (FLAGYL) IVPB 500 mg  Status:  Discontinued     500 mg 100 mL/hr over 60 Minutes Intravenous Every 8 hours 06/13/16 1751 06/15/16 1105   06/13/16 2000  levofloxacin (LEVAQUIN) IVPB 750 mg  Status:  Discontinued     750 mg 100 mL/hr over 90 Minutes Intravenous Every 48 hours 06/13/16 1818 06/15/16 1105   06/13/16 1430  metroNIDAZOLE (FLAGYL) IVPB 500 mg     500 mg 100 mL/hr over 60 Minutes Intravenous  Once 06/13/16 1425 06/13/16 1600      Medications   Scheduled Meds: .  ciprofloxacin  500 mg Oral BID  . [START ON 06/17/2016] famotidine  20 mg Oral Daily  . ferrous sulfate  325 mg Oral Q1500  . metroNIDAZOLE  500 mg Oral Q8H  . polyethylene glycol  17 g Oral Daily  . simethicone  80 mg Oral QID   Continuous Infusions:   PRN Meds:.acetaminophen **OR** acetaminophen, morphine injection, morphine injection, ondansetron **OR** ondansetron (ZOFRAN) IV   Data Review:   Micro Results Recent Results (from the past 240 hour(s))  C difficile quick scan w PCR reflex     Status: None   Collection Time: 06/13/16  8:00 PM  Result Value Ref Range Status   C Diff antigen NEGATIVE NEGATIVE Final   C Diff toxin NEGATIVE NEGATIVE Final   C Diff interpretation No C. difficile detected.  Final  Gastrointestinal Panel by PCR , Stool     Status: None   Collection Time: 06/13/16  8:00 PM  Result Value Ref Range Status   Campylobacter species NOT DETECTED NOT DETECTED Final   Plesimonas shigelloides NOT DETECTED NOT DETECTED Final   Salmonella species NOT DETECTED NOT DETECTED Final   Yersinia enterocolitica NOT DETECTED NOT DETECTED Final   Vibrio species NOT DETECTED NOT DETECTED Final   Vibrio cholerae NOT DETECTED NOT DETECTED Final   Enteroaggregative E coli (EAEC) NOT DETECTED NOT DETECTED Final   Enteropathogenic E coli (EPEC) NOT DETECTED NOT DETECTED Final   Enterotoxigenic E coli (ETEC) NOT DETECTED NOT DETECTED Final   Shiga like toxin producing E coli (STEC) NOT DETECTED NOT DETECTED Final   E. coli O157 NOT DETECTED NOT DETECTED Final   Shigella/Enteroinvasive E coli (EIEC) NOT DETECTED NOT DETECTED Final   Cryptosporidium NOT DETECTED NOT DETECTED Final   Cyclospora cayetanensis NOT DETECTED NOT DETECTED Final   Entamoeba  histolytica NOT DETECTED NOT DETECTED Final   Giardia lamblia NOT DETECTED NOT DETECTED Final   Adenovirus F40/41 NOT DETECTED NOT DETECTED Final   Astrovirus NOT DETECTED NOT DETECTED Final   Norovirus GI/GII NOT DETECTED NOT  DETECTED Final   Rotavirus A NOT DETECTED NOT DETECTED Final   Sapovirus (I, II, IV, and V) NOT DETECTED NOT DETECTED Final    Radiology Reports Dg Abdomen 1 View  Result Date: 06/13/2016 CLINICAL DATA:  Vomiting, abdominal pain. EXAM: ABDOMEN - 1 VIEW COMPARISON:  CT 10/17/2008 FINDINGS: Mild gaseous distention of bowel, predominantly colon. No evidence of bowel obstruction. No free air organomegaly. Prior left hip replacement. No acute bony abnormality. IMPRESSION: Mild gaseous distention of the colon, possibly mild ileus. Electronically Signed   By: Rolm Baptise M.D.   On: 06/13/2016 12:15   Ct Abdomen Pelvis W Contrast  Result Date: 06/13/2016 CLINICAL DATA:  Vomiting. EXAM: CT ABDOMEN AND PELVIS WITH CONTRAST TECHNIQUE: Multidetector CT imaging of the abdomen and pelvis was performed using the standard protocol following bolus administration of intravenous contrast. CONTRAST:  54mL ISOVUE-300 IOPAMIDOL (ISOVUE-300) INJECTION 61% COMPARISON:  None FINDINGS: Lower chest: Small bilateral pleural effusions are identified right greater than left. Thoracic aortic calcifications noted. There also calcifications within the RCA, LAD and left circumflex coronary artery. Hepatobiliary: For no focal liver abnormalities. The gallbladder is unremarkable. No biliary dilatation. Pancreas: No mass, inflammatory changes, or other significant abnormality. Spleen: Within normal limits in size and appearance. Adrenals/Urinary Tract: Normal adrenal glands. The kidneys are unremarkable. No mass or obstructive uropathy. The urinary bladder appears normal. Stomach/Bowel: Small hiatal hernia. The small bowel loops are increased in caliber measuring up to 2.7 cm. There is abnormal scratch set there is marked wall thickening involving the distal small bowel loops which measure up to 1.5 cm, image 63 of series 2. Dilute contrast is identified within the proximal colon. Extensive diverticular disease involves the descending  colon and sigmoid colon. No evidence for diverticulitis. Vascular/Lymphatic: Aortic atherosclerosis noted. No aneurysm. No enlarged abdominal or pelvic adenopathy. Reproductive: No mass or other significant abnormality. Other: There is a small to moderate amount of ascites identified within the upper abdomen and pelvis. No free air identified. Musculoskeletal: Degenerative disc disease is identified within the lumbar spine. No suspicious bone lesions identified. IMPRESSION: 1. Examination is positive for abnormal bowel wall thickening involving the distal small bowel loops compatible with no evidence for perforation or abscess. 2. Abnormal small bowel dilatation which may reflect ileus or partial obstruction secondary to distal small bowel inflammation. 3. Abdominal and pelvic ascites.  No abscess identified. 4. Aortic atherosclerosis. 5. Small hiatal hernia. 6. Small pleural effusions. Electronically Signed   By: Kerby Moors M.D.   On: 06/13/2016 14:17   Dg Abd 2 Views  Result Date: 06/15/2016 CLINICAL DATA:  Abdominal pain and distention.  Ileus. EXAM: ABDOMEN - 2 VIEW COMPARISON:  CT abdomen and pelvis and single view of the abdomen 06/13/2016. FINDINGS: No free intraperitoneal air is identified. Small bilateral pleural effusions and basilar airspace disease are noted. Oral contrast from the patient's CT scan is seen throughout the colon. Extensive diverticular disease of the descending and sigmoid colon is noted. Gas-filled nondilated loops of small bowel are noted. IMPRESSION: Gas-filled but nondilated loops of small bowel may be related to enteritis as seen on the prior CT scan. Negative for of small bowel obstruction. Extensive diverticulosis descending through sigmoid colon. Small bilateral pleural effusions with basilar airspace disease. Electronically Signed  By: Inge Rise M.D.   On: 06/15/2016 13:26     CBC  Recent Labs Lab 06/13/16 1144 06/14/16 0506 06/14/16 1902 06/15/16 0507  06/16/16 0442  WBC 5.6 7.0  --  10.7 11.3*  HGB 10.7* 7.6* 9.5* 9.6* 9.2*  HCT 32.4* 22.6* 28.4* 28.0* 27.4*  PLT 310 205  --  187 207  MCV 88.2 87.1  --  86.8 86.7  MCH 29.1 29.1  --  29.8 29.1  MCHC 33.0 33.4  --  34.3 33.5  RDW 14.2 14.1  --  14.3 14.4    Chemistries   Recent Labs Lab 06/13/16 1144 06/14/16 0506 06/15/16 0507 06/16/16 0442  NA 133* 134* 136 136  K 4.7 4.3 4.0 3.9  CL 98* 103 108 107  CO2 25 26 23 24   GLUCOSE 163* 122* 140* 122*  BUN 42* 39* 28* 25*  CREATININE 0.93 0.90 0.77 0.81  CALCIUM 8.2* 7.0* 7.0* 7.2*  AST 32 26  --   --   ALT 12* 11*  --   --   ALKPHOS 62 49  --   --   BILITOT 0.2* 0.3  --   --    ------------------------------------------------------------------------------------------------------------------ estimated creatinine clearance is 28.5 mL/min (by C-G formula based on SCr of 0.81 mg/dL). ------------------------------------------------------------------------------------------------------------------ No results for input(s): HGBA1C in the last 72 hours. ------------------------------------------------------------------------------------------------------------------ No results for input(s): CHOL, HDL, LDLCALC, TRIG, CHOLHDL, LDLDIRECT in the last 72 hours. ------------------------------------------------------------------------------------------------------------------ No results for input(s): TSH, T4TOTAL, T3FREE, THYROIDAB in the last 72 hours.  Invalid input(s): FREET3 ------------------------------------------------------------------------------------------------------------------  Recent Labs  06/14/16 0506  FERRITIN 63  TIBC 194*  IRON <5*    Coagulation profile No results for input(s): INR, PROTIME in the last 168 hours.  No results for input(s): DDIMER in the last 72 hours.  Cardiac Enzymes No results for input(s): CKMB, TROPONINI, MYOGLOBIN in the last 168 hours.  Invalid input(s):  CK ------------------------------------------------------------------------------------------------------------------ Invalid input(s): POCBNP    Assessment & Plan   Ludolph  is a 82 y.o. female with a known history of Chronic low back pain, chronic anemia on iron tablets, chronic anxiety, lives alone, DO NOT RESUSCITATE was in her usual state of health until last night. Patient started complaining of lower abdominal pain mainly in the left lower quadrant and was experiencing nausea and vomiting persistently. She had one bowel movement in the ED which was dark and tarry looking but not diarrhea.  #1 partial small bowel obstruction Abdominal pain improved Continues to have some nausea X-ray with some some gas in the abdomen Due to persistent symptoms we'll try some H2 blockers Start patient on simethicone Continue antibiotics Ambulate     #2 symptomatic iron deficiency anemia Status post transfusion hemoglobin stable I will give a dose of iron   #3 chronic low back pain Continue when necessary pain medications   #4 miscellaneous SCDs for DVT prophylaxis in light of her hemoglobin dropping quite positive stools     Code Status Orders        Start     Ordered   06/13/16 1751  Do not attempt resuscitation (DNR)  Continuous    Question Answer Comment  In the event of cardiac or respiratory ARREST Do not call a "code blue"   In the event of cardiac or respiratory ARREST Do not perform Intubation, CPR, defibrillation or ACLS   In the event of cardiac or respiratory ARREST Use medication by any route, position, wound care, and other measures to relive pain and  suffering. May use oxygen, suction and manual treatment of airway obstruction as needed for comfort.   Comments RN may pronounce      06/13/16 1751    Code Status History    Date Active Date Inactive Code Status Order ID Comments User Context   06/13/2016  2:54 PM 06/13/2016  5:51 PM DNR [id number]  Merlyn Lot, MD  ED   05/20/2015  3:48 AM 05/22/2015  6:48 PM DNR [id number]  Corky Sox, MD Inpatient   05/19/2015 10:14 PM 05/20/2015  3:48 AM Full Code [id number]  Corky Sox, MD Inpatient    Advance Directive Documentation   Flowsheet Row Most Recent Value  Type of Advance Directive  Healthcare Power of Attorney  Pre-existing out of facility DNR order (yellow form or pink MOST form)  No data  "MOST" Form in Place?  No data           Consults  None   DVT ProphylaxisSCDs   Lab Results  Component Value Date   PLT 207 06/16/2016     Time Spent in minutes  46min Greater than 50% of time spent in care coordination and counseling patient regarding the condition and plan of care. I have updated her niece plan for her to go to rehabilitation tomorrow  Dustin Flock M.D on 06/16/2016 at 1:16 PM  Between 7am to 6pm - Pager - [phone]  After 6pm go to [email or web address] - password [email or web address] [facility]   Office  [phone]

## 2016-06-16 NOTE — Progress Notes (Signed)
06/16/2016 1:37 PM  Informed by CCMD that pt was briefly in Afib.  Expressed concern that she may convert to Afib again.  Sent page to Dr. Dorrene German and awaiting any new orders or interventions.  Will continue to monitor and assess.  Dola Argyle, RN

## 2016-06-16 NOTE — Progress Notes (Signed)
Physical Therapy Treatment Patient Details Name: Kelly Porter MRN: [id number] DOB: December 06, 1918 Today's Date: 06/16/2016    History of Present Illness Pt is a 82 y.o. female with PMH including small hiatal hernia, anxiety, PNA, chronic LBP, and anemia.  Pt presenting to hospital with 4 days severe diffuse abdominal pain with contipation and N/V.  Pt admitted with ileus.    PT Comments    Pt demonstrating decline in functional status compared to PT evaluation yesterday.  Pt seen ambulating with nursing staff in hallway with flexed posture, occasional buckling of knees, and significant decrease in speed and requiring sitting rest break in chair in hallway after short ambulation from room.  After sitting rest break, PT assisted pt with ambulation back to room with RW (x30 feet) CGA to min assist and pt continued with occasional buckling of knees, flexed posture, and significant decrease in speed.  This is a significant change compared to yesterday when pt able to ambulate nursing loop with RW CGA with good balance and gait quality.  Pt demonstrates significant fatigue and decreased activity tolerance compared to yesterday limiting session activities.  D/t change in functional status, pt does not appear safe to discharge home.  Recommend pt discharge to STR (MD, nursing, and care management notified).   Follow Up Recommendations  SNF     Equipment Recommendations   (youth sized RW)    Recommendations for Other Services       Precautions / Restrictions Precautions Precautions: Fall Restrictions Weight Bearing Restrictions: No    Mobility  Bed Mobility Overal bed mobility: Needs Assistance Bed Mobility: Sit to Supine       Sit to supine: Max assist   General bed mobility comments: Assist for trunk and B LE's d/t fatigue post ambulation.  Transfers Overall transfer level: Needs assistance Equipment used: Rolling walker (2 wheeled) Transfers: Sit to/from Stand Sit to Stand: Min  assist         General transfer comment: increased effort and time to stand required  Ambulation/Gait Ambulation/Gait assistance: Min guard;Min assist Ambulation Distance (Feet): 30 Feet Assistive device: Rolling walker (2 wheeled)   Gait velocity: significant decreased cadence compared to yesterday   General Gait Details: decreased B step length/foot clearance/heelstrike; occasional buckling of knees noted; limited distance d/t fatigue   Stairs            Wheelchair Mobility    Modified Rankin (Stroke Patients Only)       Balance Overall balance assessment: Needs assistance Sitting-balance support: Bilateral upper extremity supported;Feet supported Sitting balance-Leahy Scale: Fair     Standing balance support: Bilateral upper extremity supported (on RW; static) Standing balance-Leahy Scale: Fair                      Cognition Arousal/Alertness: Awake/alert Behavior During Therapy: WFL for tasks assessed/performed Overall Cognitive Status:  (Pt oriented to self but requiring increased time to respond (complicated d/t HOH and fatigue))                      Exercises      General Comments General comments (skin integrity, edema, etc.): Pt received in hallway (pt ambulating with nursing staff using RW).  Nursing cleared pt for participation in physical therapy.  Pt agreeable to short PT session.      Pertinent Vitals/Pain Pain Assessment: No/denies pain  See flow sheet for HR and O2 vitals.    Home Living  Prior Function            PT Goals (current goals can now be found in the care plan section) Acute Rehab PT Goals Patient Stated Goal: to go home PT Goal Formulation: With patient Time For Goal Achievement: 06/29/16 Potential to Achieve Goals: Fair Progress towards PT goals: Not progressing toward goals - comment (pt with decline in functional status compared to yesterday)    Frequency  Min  2X/week    PT Plan Discharge plan needs to be updated    Co-evaluation             End of Session Equipment Utilized During Treatment: Gait belt Activity Tolerance: Patient limited by fatigue Patient left: in bed;with call bell/phone within reach;with nursing/sitter in room (Nursing staff placing pt on telemetry end of session)     Time: [id number] PT Time Calculation (min) (ACUTE ONLY): 9 min  Charges:  $Gait Training: 8-22 mins                    G CodesLeitha Bleak 2016-07-08, 10:27 AM Leitha Bleak, [place]

## 2016-06-16 NOTE — Progress Notes (Signed)
Dr. Manuella Ghazi notified of short PSVT run. No new orders at this time. Pt asymptomatic, sleeping.

## 2016-06-17 LAB — CBC
HCT: 26.9 % — ABNORMAL LOW (ref 35.0–47.0)
HEMOGLOBIN: 9.1 g/dL — AB (ref 12.0–16.0)
MCH: 29.5 pg (ref 26.0–34.0)
MCHC: 33.8 g/dL (ref 32.0–36.0)
MCV: 87.4 fL (ref 80.0–100.0)
Platelets: 202 10*3/uL (ref 150–440)
RBC: 3.08 MIL/uL — AB (ref 3.80–5.20)
RDW: 14.2 % (ref 11.5–14.5)
WBC: 8.2 10*3/uL (ref 3.6–11.0)

## 2016-06-17 LAB — CREATININE, SERUM
Creatinine, Ser: 0.77 mg/dL (ref 0.44–1.00)
GFR calc Af Amer: >60 mL/min (ref >60)

## 2016-06-17 MED ORDER — ONDANSETRON HCL 4 MG PO TABS: 4.0000 mg | ORAL_TABLET | Freq: Four times a day (QID) | ORAL | 0 refills | Status: AC | PRN

## 2016-06-17 MED ORDER — TRAMADOL HCL 50 MG PO TABS: 25.0000 mg | ORAL_TABLET | Freq: Four times a day (QID) | ORAL | 0 refills | Status: DC | PRN

## 2016-06-17 MED ORDER — METOPROLOL TARTRATE 25 MG PO TABS: 25.0000 mg | ORAL_TABLET | Freq: Two times a day (BID) | ORAL | 0 refills | Status: DC

## 2016-06-17 MED ORDER — ACETAMINOPHEN 325 MG PO TABS: 650.0000 mg | ORAL_TABLET | Freq: Four times a day (QID) | ORAL | Status: AC | PRN

## 2016-06-17 NOTE — Progress Notes (Signed)
Report called to [facility]. Awaiting on Niece to pick up pt. IV and tele removed.

## 2016-06-17 NOTE — Discharge Summary (Signed)
Kelly Porter, [place] y.o., DOB [birth year], MRN [id number]. Admission date: 06/13/2016 Discharge Date 06/17/2016 Primary MD Henrine Screws, MD Admitting Physician Nicholes Mango, MD  Admission Diagnosis  Dehydration [E86.0] Colitis [K52.9] Non-intractable vomiting with nausea, vomiting of unspecified type [R11.2]  Discharge Diagnosis   Active Problems:  Ileus ([place]) Acute on chronic iron deficiency anemia due to chronic GI bleed Nausea vomiting Colitis History of small bowel obstruction Anemia Generalized deconditioning Chronic anemia Anxiety Osteoarthritis Chronic low back pain Paroxysmal atrial fibrillation      Hospital Course  Kelly Porter  is a 82 y.o. female with a known history of Chronic low back pain, chronic anemia on iron tablets, chronic anxiety, lives alone, DO NOT RESUSCITATE was in her usual state of health until last night. Patient started complaining of lower abdominal pain mainly in the left lower quadrant and was experiencing nausea and vomiting persistently. Patient was seen in the emergency room and had a CT scan of the abdomen which revealed a partial small bowel obstruction. Patient does have history of small bowel obstructions in the past. Case was discussed with the family they did not want any surgical intervention. She was treated conservatively. She did have symptoms inflammation noted in the colon. And was treated as colitis and enteritis. Now patient's ileus is resolved. Her nausea vomiting is resolved. Patient has does have a history of anemia and her hemoglobin was worse. Patient was given IV iron as well as transfused blood. Patient is doing much better she does not need any further antibiotics. She is very weak and deconditioned and needle her rehabilitation.          Consults  None  Significant Tests:  See full reports for all details     Dg Abdomen 1 View  Result Date: 06/13/2016 CLINICAL DATA:  Vomiting, abdominal pain. EXAM: ABDOMEN - 1 VIEW  COMPARISON:  CT 10/17/2008 FINDINGS: Mild gaseous distention of bowel, predominantly colon. No evidence of bowel obstruction. No free air organomegaly. Prior left hip replacement. No acute bony abnormality. IMPRESSION: Mild gaseous distention of the colon, possibly mild ileus. Electronically Signed   By: Rolm Baptise M.D.   On: 06/13/2016 12:15   Ct Abdomen Pelvis W Contrast  Result Date: 06/13/2016 CLINICAL DATA:  Vomiting. EXAM: CT ABDOMEN AND PELVIS WITH CONTRAST TECHNIQUE: Multidetector CT imaging of the abdomen and pelvis was performed using the standard protocol following bolus administration of intravenous contrast. CONTRAST:  23mL ISOVUE-300 IOPAMIDOL (ISOVUE-300) INJECTION 61% COMPARISON:  None FINDINGS: Lower chest: Small bilateral pleural effusions are identified right greater than left. Thoracic aortic calcifications noted. There also calcifications within the RCA, LAD and left circumflex coronary artery. Hepatobiliary: For no focal liver abnormalities. The gallbladder is unremarkable. No biliary dilatation. Pancreas: No mass, inflammatory changes, or other significant abnormality. Spleen: Within normal limits in size and appearance. Adrenals/Urinary Tract: Normal adrenal glands. The kidneys are unremarkable. No mass or obstructive uropathy. The urinary bladder appears normal. Stomach/Bowel: Small hiatal hernia. The small bowel loops are increased in caliber measuring up to 2.7 cm. There is abnormal scratch set there is marked wall thickening involving the distal small bowel loops which measure up to 1.5 cm, image 63 of series 2. Dilute contrast is identified within the proximal colon. Extensive diverticular disease involves the descending colon and sigmoid colon. No evidence for diverticulitis. Vascular/Lymphatic: Aortic atherosclerosis noted. No aneurysm. No enlarged abdominal or pelvic adenopathy. Reproductive: No mass or other significant abnormality. Other: There is a small to moderate amount of  ascites  identified within the upper abdomen and pelvis. No free air identified. Musculoskeletal: Degenerative disc disease is identified within the lumbar spine. No suspicious bone lesions identified. IMPRESSION: 1. Examination is positive for abnormal bowel wall thickening involving the distal small bowel loops compatible with no evidence for perforation or abscess. 2. Abnormal small bowel dilatation which may reflect ileus or partial obstruction secondary to distal small bowel inflammation. 3. Abdominal and pelvic ascites.  No abscess identified. 4. Aortic atherosclerosis. 5. Small hiatal hernia. 6. Small pleural effusions. Electronically Signed   By: Kerby Moors M.D.   On: 06/13/2016 14:17   Dg Abd 2 Views  Result Date: 06/15/2016 CLINICAL DATA:  Abdominal pain and distention.  Ileus. EXAM: ABDOMEN - 2 VIEW COMPARISON:  CT abdomen and pelvis and single view of the abdomen 06/13/2016. FINDINGS: No free intraperitoneal air is identified. Small bilateral pleural effusions and basilar airspace disease are noted. Oral contrast from the patient's CT scan is seen throughout the colon. Extensive diverticular disease of the descending and sigmoid colon is noted. Gas-filled nondilated loops of small bowel are noted. IMPRESSION: Gas-filled but nondilated loops of small bowel may be related to enteritis as seen on the prior CT scan. Negative for of small bowel obstruction. Extensive diverticulosis descending through sigmoid colon. Small bilateral pleural effusions with basilar airspace disease. Electronically Signed   By: Inge Rise M.D.   On: 06/15/2016 13:26       Today   Subjective:   Kelly Porter patient is very weak denies any abdominal pain or chest pain  Objective:   Blood pressure (!) 132/58, pulse 92, temperature 98.5 F (36.9 C), temperature source Oral, resp. rate 17, height 5\' 1"  (1.549 m), weight 43.5 kg (95 lb 12.8 oz), SpO2 91 %.  .  Intake/Output Summary (Last 24 hours) at  06/17/16 1005 Last data filed at 06/17/16 0931  Gross per 24 hour  Intake              560 ml  Output              800 ml  Net             -240 ml    Exam VITAL SIGNS: Blood pressure (!) 132/58, pulse 92, temperature 98.5 F (36.9 C), temperature source Oral, resp. rate 17, height 5\' 1"  (1.549 m), weight 43.5 kg (95 lb 12.8 oz), SpO2 91 %.  GENERAL:  82 y.o.-year-old patient lying in the bed with no acute distress.  EYES: Pupils equal, round, reactive to light and accommodation. No scleral icterus. Extraocular muscles intact.  HEENT: Head atraumatic, normocephalic. Oropharynx and nasopharynx clear.  NECK:  Supple, no jugular venous distention. No thyroid enlargement, no tenderness.  LUNGS: Normal breath sounds bilaterally, no wheezing, rales,rhonchi or crepitation. No use of accessory muscles of respiration.  CARDIOVASCULAR: S1, S2 normal. No murmurs, rubs, or gallops.  ABDOMEN: Soft, nontender, nondistended. Bowel sounds present. No organomegaly or mass.  EXTREMITIES: No pedal edema, cyanosis, or clubbing.  NEUROLOGIC: Cranial nerves II through XII are intact. Muscle strength 5/5 in all extremities. Sensation intact. Gait not checked.  PSYCHIATRIC: The patient is alert and oriented x 3.  SKIN: No obvious rash, lesion, or ulcer.   Data Review     CBC w Diff: Lab Results  Component Value Date   WBC 8.2 06/17/2016   HGB 9.1 (L) 06/17/2016   HCT 26.9 (L) 06/17/2016   PLT 202 06/17/2016   LYMPHOPCT 8 (L) 05/19/2015   MONOPCT 4  05/19/2015   EOSPCT 0 05/19/2015   BASOPCT 0 05/19/2015   CMP: Lab Results  Component Value Date   NA 136 06/16/2016   K 3.9 06/16/2016   CL 107 06/16/2016   CO2 24 06/16/2016   BUN 25 (H) 06/16/2016   CREATININE 0.77 06/17/2016   PROT 4.3 (L) 06/14/2016   ALBUMIN 2.3 (L) 06/14/2016   BILITOT 0.3 06/14/2016   ALKPHOS 49 06/14/2016   AST 26 06/14/2016   ALT 11 (L) 06/14/2016  .  Micro Results Recent Results (from the past 240 hour(s))  C  difficile quick scan w PCR reflex     Status: None   Collection Time: 06/13/16  8:00 PM  Result Value Ref Range Status   C Diff antigen NEGATIVE NEGATIVE Final   C Diff toxin NEGATIVE NEGATIVE Final   C Diff interpretation No C. difficile detected.  Final  Gastrointestinal Panel by PCR , Stool     Status: None   Collection Time: 06/13/16  8:00 PM  Result Value Ref Range Status   Campylobacter species NOT DETECTED NOT DETECTED Final   Plesimonas shigelloides NOT DETECTED NOT DETECTED Final   Salmonella species NOT DETECTED NOT DETECTED Final   Yersinia enterocolitica NOT DETECTED NOT DETECTED Final   Vibrio species NOT DETECTED NOT DETECTED Final   Vibrio cholerae NOT DETECTED NOT DETECTED Final   Enteroaggregative E coli (EAEC) NOT DETECTED NOT DETECTED Final   Enteropathogenic E coli (EPEC) NOT DETECTED NOT DETECTED Final   Enterotoxigenic E coli (ETEC) NOT DETECTED NOT DETECTED Final   Shiga like toxin producing E coli (STEC) NOT DETECTED NOT DETECTED Final   E. coli O157 NOT DETECTED NOT DETECTED Final   Shigella/Enteroinvasive E coli (EIEC) NOT DETECTED NOT DETECTED Final   Cryptosporidium NOT DETECTED NOT DETECTED Final   Cyclospora cayetanensis NOT DETECTED NOT DETECTED Final   Entamoeba histolytica NOT DETECTED NOT DETECTED Final   Giardia lamblia NOT DETECTED NOT DETECTED Final   Adenovirus F40/41 NOT DETECTED NOT DETECTED Final   Astrovirus NOT DETECTED NOT DETECTED Final   Norovirus GI/GII NOT DETECTED NOT DETECTED Final   Rotavirus A NOT DETECTED NOT DETECTED Final   Sapovirus (I, II, IV, and V) NOT DETECTED NOT DETECTED Final        Code Status Orders        Start     Ordered   06/13/16 1751  Do not attempt resuscitation (DNR)  Continuous    Question Answer Comment  In the event of cardiac or respiratory ARREST Do not call a "code blue"   In the event of cardiac or respiratory ARREST Do not perform Intubation, CPR, defibrillation or ACLS   In the event of  cardiac or respiratory ARREST Use medication by any route, position, wound care, and other measures to relive pain and suffering. May use oxygen, suction and manual treatment of airway obstruction as needed for comfort.   Comments RN may pronounce      06/13/16 1751    Code Status History    Date Active Date Inactive Code Status Order ID Comments User Context   06/13/2016  2:54 PM 06/13/2016  5:51 PM DNR [id number]  Merlyn Lot, MD ED   05/20/2015  3:48 AM 05/22/2015  6:48 PM DNR [id number]  Corky Sox, MD Inpatient   05/19/2015 10:14 PM 05/20/2015  3:48 AM Full Code [id number]  Corky Sox, MD Inpatient    Advance Directive Documentation   Flowsheet Row Most Recent Value  Type  of [place]  Pre-existing out of facility DNR order (yellow form or pink MOST form)  No data  "MOST" Form in Place?  No data          Follow-up Information    GATES,ROBERT NEVILL, MD Follow up in 2 week(s).   Specialty:  Internal Medicine Contact information: [address] [place] [address] [phone]           Discharge Medications     Medication List    TAKE these medications   acetaminophen 325 MG tablet Commonly known as:  TYLENOL Take 2 tablets (650 mg total) by mouth every 6 (six) hours as needed for mild pain (or Fever >/= 101).   Cranberry 500 MG Chew Chew 500 mg by mouth daily.   cyclobenzaprine 5 MG tablet Commonly known as:  FLEXERIL Take 1 tablet (5 mg total) by mouth 3 (three) times daily as needed for muscle spasms.   ferrous sulfate 325 (65 FE) MG tablet Take 325 mg by mouth daily with breakfast.   metoprolol tartrate 25 MG tablet Commonly known as:  LOPRESSOR Take 1 tablet (25 mg total) by mouth 2 (two) times daily.   MULTIVITAMIN ADULT PO Take 1 tablet by mouth daily.   ondansetron 4 MG tablet Commonly known as:  ZOFRAN Take 1 tablet (4 mg total) by mouth every 6 (six) hours as needed for nausea.    pantoprazole 40 MG tablet Commonly known as:  PROTONIX Take 1 tablet (40 mg total) by mouth daily.   polyethylene glycol packet Commonly known as:  MIRALAX / GLYCOLAX Take 17 g by mouth daily as needed for moderate constipation.   traMADol 50 MG tablet Commonly known as:  ULTRAM Take 0.5 tablets (25 mg total) by mouth every 6 (six) hours as needed for moderate pain or severe pain.   vitamin C 500 MG tablet Commonly known as:  ASCORBIC ACID Take 500 mg by mouth daily.   Vitamin D3 1000 units Caps Take 1,000 Units by mouth 2 (two) times daily.          Total Time in preparing paper work, data evaluation and todays exam - 35 minutes  Dustin Flock M.D on 06/17/2016 at 10:05 AM  [facility]   Office  [phone]

## 2016-06-17 NOTE — Discharge Instructions (Signed)
°  DIET:  Cardiac diet  DISCHARGE CONDITION:  Stable  ACTIVITY:  Activity as tolerated  OXYGEN:  Home Oxygen: No.   Oxygen Delivery: room air  DISCHARGE LOCATION:  nursing home    ADDITIONAL DISCHARGE INSTRUCTION:pt eval and treat   If you experience worsening of your admission symptoms, develop shortness of breath, life threatening emergency, suicidal or homicidal thoughts you must seek medical attention immediately by calling 911 or calling your MD immediately  if symptoms less severe.  You Must read complete instructions/literature along with all the possible adverse reactions/side effects for all the Medicines you take and that have been prescribed to you. Take any new Medicines after you have completely understood and accpet all the possible adverse reactions/side effects.   Please note  You were cared for by a hospitalist during your hospital stay. If you have any questions about your discharge medications or the care you received while you were in the hospital after you are discharged, you can call the unit and asked to speak with the hospitalist on call if the hospitalist that took care of you is not available. Once you are discharged, your primary care physician will handle any further medical issues. Please note that NO REFILLS for any discharge medications will be authorized once you are discharged, as it is imperative that you return to your primary care physician (or establish a relationship with a primary care physician if you do not have one) for your aftercare needs so that they can reassess your need for medications and monitor your lab values.

## 2016-06-17 NOTE — Clinical Social Work Note (Signed)
MSW spoke to patient's niece Fonnie Birkenhead [phone] who stated she will transport patient to [place] today.  Patient's niece said she will be here between 56 and 4:15 to pick up patient.  [facility] has been notified, patient to be d/c'ed today to [place].  Patient and family agreeable to plans will transport via niece's car RN to call report to [phone] room 612.  Evette Cristal, MSW Mon-Fri 8a-4:30p [phone]

## 2016-08-31 ENCOUNTER — Emergency Department (HOSPITAL_COMMUNITY): Payer: Medicare Other

## 2016-08-31 ENCOUNTER — Inpatient Hospital Stay (HOSPITAL_COMMUNITY)
Admission: EM | Admit: 2016-08-31 | Discharge: 2016-09-05 | DRG: 871 | Disposition: A | Discharge status: Skilled Nursing Facility | Payer: Medicare Other | Attending: Internal Medicine | Admitting: Internal Medicine

## 2016-08-31 ENCOUNTER — Encounter (HOSPITAL_COMMUNITY): Payer: Self-pay | Admitting: Radiology

## 2016-08-31 DIAGNOSIS — K529 Noninfective gastroenteritis and colitis, unspecified: Secondary | ICD-10-CM | POA: Diagnosis not present

## 2016-08-31 DIAGNOSIS — R4781 Slurred speech: Secondary | ICD-10-CM

## 2016-08-31 DIAGNOSIS — D649 Anemia, unspecified: Secondary | ICD-10-CM

## 2016-08-31 DIAGNOSIS — K6389 Other specified diseases of intestine: Secondary | ICD-10-CM | POA: Diagnosis present

## 2016-08-31 DIAGNOSIS — C189 Malignant neoplasm of colon, unspecified: Secondary | ICD-10-CM | POA: Diagnosis present

## 2016-08-31 DIAGNOSIS — A419 Sepsis, unspecified organism: Principal | ICD-10-CM | POA: Diagnosis present

## 2016-08-31 DIAGNOSIS — Z79899 Other long term (current) drug therapy: Secondary | ICD-10-CM | POA: Diagnosis not present

## 2016-08-31 DIAGNOSIS — D509 Iron deficiency anemia, unspecified: Secondary | ICD-10-CM | POA: Diagnosis present

## 2016-08-31 DIAGNOSIS — R109 Unspecified abdominal pain: Secondary | ICD-10-CM

## 2016-08-31 DIAGNOSIS — Z22322 Carrier or suspected carrier of Methicillin resistant Staphylococcus aureus: Secondary | ICD-10-CM

## 2016-08-31 DIAGNOSIS — K5669 Other partial intestinal obstruction: Secondary | ICD-10-CM | POA: Diagnosis not present

## 2016-08-31 DIAGNOSIS — A09 Infectious gastroenteritis and colitis, unspecified: Secondary | ICD-10-CM | POA: Diagnosis present

## 2016-08-31 DIAGNOSIS — Z85820 Personal history of malignant melanoma of skin: Secondary | ICD-10-CM | POA: Diagnosis not present

## 2016-08-31 DIAGNOSIS — Z66 Do not resuscitate: Secondary | ICD-10-CM | POA: Diagnosis present

## 2016-08-31 DIAGNOSIS — M545 Low back pain: Secondary | ICD-10-CM | POA: Diagnosis present

## 2016-08-31 DIAGNOSIS — R6521 Severe sepsis with septic shock: Secondary | ICD-10-CM | POA: Diagnosis present

## 2016-08-31 DIAGNOSIS — F419 Anxiety disorder, unspecified: Secondary | ICD-10-CM | POA: Diagnosis present

## 2016-08-31 DIAGNOSIS — N179 Acute kidney failure, unspecified: Secondary | ICD-10-CM | POA: Diagnosis present

## 2016-08-31 DIAGNOSIS — Z8619 Personal history of other infectious and parasitic diseases: Secondary | ICD-10-CM

## 2016-08-31 DIAGNOSIS — R4701 Aphasia: Secondary | ICD-10-CM | POA: Diagnosis present

## 2016-08-31 DIAGNOSIS — G894 Chronic pain syndrome: Secondary | ICD-10-CM | POA: Diagnosis not present

## 2016-08-31 DIAGNOSIS — K639 Disease of intestine, unspecified: Secondary | ICD-10-CM | POA: Diagnosis not present

## 2016-08-31 DIAGNOSIS — G8929 Other chronic pain: Secondary | ICD-10-CM | POA: Diagnosis present

## 2016-08-31 DIAGNOSIS — I10 Essential (primary) hypertension: Secondary | ICD-10-CM | POA: Diagnosis present

## 2016-08-31 DIAGNOSIS — R935 Abnormal findings on diagnostic imaging of other abdominal regions, including retroperitoneum: Secondary | ICD-10-CM

## 2016-08-31 DIAGNOSIS — R079 Chest pain, unspecified: Secondary | ICD-10-CM

## 2016-08-31 DIAGNOSIS — K56609 Unspecified intestinal obstruction, unspecified as to partial versus complete obstruction: Secondary | ICD-10-CM

## 2016-08-31 DIAGNOSIS — G9341 Metabolic encephalopathy: Secondary | ICD-10-CM | POA: Diagnosis present

## 2016-08-31 DIAGNOSIS — R262 Difficulty in walking, not elsewhere classified: Secondary | ICD-10-CM

## 2016-08-31 LAB — COMPREHENSIVE METABOLIC PANEL
ALBUMIN: 3.2 g/dL — AB (ref 3.5–5.0)
ALT: 11 U/L — AB (ref 14–54)
AST: 38 U/L (ref 15–41)
Alkaline Phosphatase: 69 U/L (ref 38–126)
Anion gap: 14 (ref 5–15)
BUN: 31 mg/dL — AB (ref 6–20)
CHLORIDE: 100 mmol/L — AB (ref 101–111)
CO2: 21 mmol/L — AB (ref 22–32)
CREATININE: 1.25 mg/dL — AB (ref 0.44–1.00)
Calcium: 8.5 mg/dL — ABNORMAL LOW (ref 8.9–10.3)
GFR calc Af Amer: 41 mL/min — ABNORMAL LOW (ref >60)
GFR, EST NON AFRICAN AMERICAN: 35 mL/min — AB (ref >60)
GLUCOSE: 163 mg/dL — AB (ref 65–99)
Potassium: 4.6 mmol/L (ref 3.5–5.1)
SODIUM: 135 mmol/L (ref 135–145)
Total Bilirubin: 0.7 mg/dL (ref 0.3–1.2)
Total Protein: 5.4 g/dL — ABNORMAL LOW (ref 6.5–8.1)

## 2016-08-31 LAB — URINALYSIS, ROUTINE W REFLEX MICROSCOPIC
BILIRUBIN URINE: NEGATIVE
GLUCOSE, UA: NEGATIVE mg/dL
HGB URINE DIPSTICK: NEGATIVE
Ketones, ur: NEGATIVE mg/dL
Leukocytes, UA: NEGATIVE
Nitrite: NEGATIVE
PROTEIN: NEGATIVE mg/dL
Specific Gravity, Urine: 1.042 — ABNORMAL HIGH (ref 1.005–1.030)
pH: 5.5 (ref 5.0–8.0)

## 2016-08-31 LAB — RAPID URINE DRUG SCREEN, HOSP PERFORMED
AMPHETAMINES: NOT DETECTED
BARBITURATES: NOT DETECTED
BENZODIAZEPINES: NOT DETECTED
COCAINE: NOT DETECTED
Opiates: NOT DETECTED
TETRAHYDROCANNABINOL: NOT DETECTED

## 2016-08-31 LAB — DIFFERENTIAL
BASOS PCT: 0 %
Basophils Absolute: 0 10*3/uL (ref 0.0–0.1)
EOS ABS: 0 10*3/uL (ref 0.0–0.7)
Eosinophils Relative: 0 %
LYMPHS PCT: 6 %
Lymphs Abs: 0.3 10*3/uL — ABNORMAL LOW (ref 0.7–4.0)
Monocytes Absolute: 0.4 10*3/uL (ref 0.1–1.0)
Monocytes Relative: 7 %
NEUTROS PCT: 87 %
Neutro Abs: 5.1 10*3/uL (ref 1.7–7.7)
WBC MORPHOLOGY: INCREASED

## 2016-08-31 LAB — I-STAT CG4 LACTIC ACID, ED
LACTIC ACID, VENOUS: 4.08 mmol/L — AB (ref 0.5–1.9)
Lactic Acid, Venous: 3.14 mmol/L (ref 0.5–1.9)

## 2016-08-31 LAB — I-STAT CHEM 8, ED
BUN: 30 mg/dL — AB (ref 6–20)
CALCIUM ION: 1.02 mmol/L — AB (ref 1.15–1.40)
CREATININE: 1 mg/dL (ref 0.44–1.00)
Chloride: 101 mmol/L (ref 101–111)
Glucose, Bld: 162 mg/dL — ABNORMAL HIGH (ref 65–99)
HEMATOCRIT: 32 % — AB (ref 36.0–46.0)
Hemoglobin: 10.9 g/dL — ABNORMAL LOW (ref 12.0–15.0)
Potassium: 4.4 mmol/L (ref 3.5–5.1)
Sodium: 136 mmol/L (ref 135–145)
TCO2: 24 mmol/L (ref 0–100)

## 2016-08-31 LAB — CBC
HCT: 31.3 % — ABNORMAL LOW (ref 36.0–46.0)
HEMOGLOBIN: 9.7 g/dL — AB (ref 12.0–15.0)
MCH: 27.2 pg (ref 26.0–34.0)
MCHC: 31 g/dL (ref 30.0–36.0)
MCV: 87.7 fL (ref 78.0–100.0)
Platelets: 305 10*3/uL (ref 150–400)
RBC: 3.57 MIL/uL — AB (ref 3.87–5.11)
RDW: 14.2 % (ref 11.5–15.5)
WBC: 5.8 10*3/uL (ref 4.0–10.5)

## 2016-08-31 LAB — LACTIC ACID, PLASMA
Lactic Acid, Venous: 3.1 mmol/L (ref 0.5–1.9)
Lactic Acid, Venous: 2.6 mmol/L (ref 0.5–1.9)

## 2016-08-31 LAB — PROTIME-INR
INR: 1.09
Prothrombin Time: 14.2 seconds (ref 11.4–15.2)

## 2016-08-31 LAB — I-STAT TROPONIN, ED: Troponin i, poc: 0 ng/mL (ref 0.00–0.08)

## 2016-08-31 LAB — ETHANOL: Alcohol, Ethyl (B): <5 mg/dL (ref <5)

## 2016-08-31 LAB — PROCALCITONIN: Procalcitonin: 22.31 ng/mL

## 2016-08-31 LAB — POTASSIUM: Potassium: 4.9 mmol/L (ref 3.5–5.1)

## 2016-08-31 LAB — APTT: aPTT: 24 seconds (ref 24–36)

## 2016-08-31 LAB — MRSA PCR SCREENING: MRSA BY PCR: POSITIVE — AB

## 2016-08-31 MED ORDER — SODIUM CHLORIDE 0.9% FLUSH
3.0000 mL | Freq: Two times a day (BID) | INTRAVENOUS | Status: DC
  Administered 2016-08-31 – 2016-09-05 (×9): 3 mL via INTRAVENOUS

## 2016-08-31 MED ORDER — PIPERACILLIN-TAZOBACTAM 3.375 G IVPB 30 MIN
3.3750 g | Freq: Once | INTRAVENOUS | Status: AC
  Administered 2016-08-31: 3.375 g via INTRAVENOUS
  Filled 2016-08-31: qty 50

## 2016-08-31 MED ORDER — ONDANSETRON HCL 4 MG/2ML IJ SOLN: 4.0000 mg | Freq: Four times a day (QID) | INTRAMUSCULAR | Status: DC | PRN

## 2016-08-31 MED ORDER — FENTANYL CITRATE (PF) 100 MCG/2ML IJ SOLN: 12.5000 ug | INTRAMUSCULAR | Status: DC | PRN

## 2016-08-31 MED ORDER — SODIUM CHLORIDE 0.9 % IV SOLN
100.0000 mL/h | INTRAVENOUS | Status: DC
  Administered 2016-08-31 – 2016-09-01 (×2): 100 mL/h via INTRAVENOUS

## 2016-08-31 MED ORDER — TRAMADOL HCL 50 MG PO TABS
25.0000 mg | ORAL_TABLET | Freq: Four times a day (QID) | ORAL | Status: DC | PRN
  Filled 2016-08-31: qty 1

## 2016-08-31 MED ORDER — PIPERACILLIN-TAZOBACTAM IN DEX 2-0.25 GM/50ML IV SOLN
2.2500 g | Freq: Three times a day (TID) | INTRAVENOUS | Status: DC
  Administered 2016-08-31 – 2016-09-02 (×5): 2.25 g via INTRAVENOUS
  Filled 2016-08-31 (×7): qty 50

## 2016-08-31 MED ORDER — IOPAMIDOL (ISOVUE-300) INJECTION 61%
INTRAVENOUS | Status: AC
  Administered 2016-08-31: 80 mL
  Filled 2016-08-31: qty 100

## 2016-08-31 MED ORDER — VANCOMYCIN HCL 500 MG IV SOLR
500.0000 mg | INTRAVENOUS | Status: DC
  Administered 2016-09-01: 500 mg via INTRAVENOUS
  Filled 2016-08-31 (×3): qty 500

## 2016-08-31 MED ORDER — SODIUM CHLORIDE 0.9 % IV BOLUS (SEPSIS)
500.0000 mL | Freq: Once | INTRAVENOUS | Status: AC
  Administered 2016-08-31: 500 mL via INTRAVENOUS

## 2016-08-31 MED ORDER — PIPERACILLIN-TAZOBACTAM IN DEX 2-0.25 GM/50ML IV SOLN
2.2500 g | Freq: Four times a day (QID) | INTRAVENOUS | Status: DC
  Filled 2016-08-31 (×2): qty 50

## 2016-08-31 MED ORDER — HEPARIN SODIUM (PORCINE) 5000 UNIT/ML IJ SOLN
5000.0000 [IU] | Freq: Three times a day (TID) | INTRAMUSCULAR | Status: DC
  Administered 2016-08-31 – 2016-09-05 (×14): 5000 [IU] via SUBCUTANEOUS
  Filled 2016-08-31 (×14): qty 1

## 2016-08-31 MED ORDER — FLEET ENEMA 7-19 GM/118ML RE ENEM
1.0000 | ENEMA | Freq: Once | RECTAL | Status: AC
Start: 2016-08-31 — End: 2016-08-31
  Administered 2016-08-31: 1 via RECTAL
  Filled 2016-08-31: qty 1

## 2016-08-31 MED ORDER — ONDANSETRON HCL 4 MG PO TABS: 4.0000 mg | ORAL_TABLET | Freq: Four times a day (QID) | ORAL | Status: DC | PRN

## 2016-08-31 MED ORDER — METOPROLOL TARTRATE 5 MG/5ML IV SOLN: 2.5000 mg | INTRAVENOUS | Status: DC | PRN

## 2016-08-31 MED ORDER — FLEET ENEMA 7-19 GM/118ML RE ENEM
1.0000 | ENEMA | Freq: Once | RECTAL | Status: AC
  Administered 2016-08-31: 1 via RECTAL
  Filled 2016-08-31: qty 1

## 2016-08-31 MED ORDER — VANCOMYCIN HCL IN DEXTROSE 750-5 MG/150ML-% IV SOLN
750.0000 mg | Freq: Once | INTRAVENOUS | Status: AC
  Administered 2016-08-31: 750 mg via INTRAVENOUS
  Filled 2016-08-31 (×2): qty 150

## 2016-08-31 MED ORDER — SODIUM CHLORIDE 0.9 % IV BOLUS (SEPSIS)
1000.0000 mL | Freq: Once | INTRAVENOUS | Status: AC
  Administered 2016-08-31: 1000 mL via INTRAVENOUS

## 2016-08-31 MED ORDER — DEXTROSE-NACL 5-0.45 % IV SOLN
INTRAVENOUS | Status: DC
  Administered 2016-08-31: 18:00:00 via INTRAVENOUS

## 2016-08-31 MED ORDER — ACETAMINOPHEN 650 MG RE SUPP: 650.0000 mg | Freq: Four times a day (QID) | RECTAL | Status: DC | PRN

## 2016-08-31 MED ORDER — ACETAMINOPHEN 325 MG PO TABS: 650.0000 mg | ORAL_TABLET | Freq: Four times a day (QID) | ORAL | Status: DC | PRN

## 2016-08-31 MED ORDER — VANCOMYCIN HCL IN DEXTROSE 1-5 GM/200ML-% IV SOLN: 1000.0000 mg | Freq: Once | INTRAVENOUS | Status: DC

## 2016-08-31 NOTE — ED Notes (Signed)
Attempted report x1 to 3S

## 2016-08-31 NOTE — Progress Notes (Signed)
Critical lactic acid. 3.1. Physician notified through amnion.

## 2016-08-31 NOTE — ED Notes (Signed)
Spoke to Dr. Leonette Monarch regarding blood cult x2 order. MD acknowledged and to place order. Blood cult x1 drawn prior to antix administration. MD aware zosyn antix initiated prior to second blood cult.

## 2016-08-31 NOTE — Progress Notes (Signed)
Patient given enema as ordered. Large amount of soft stool as well as liquid. Dark colored, patient c/o abdominal pain.

## 2016-08-31 NOTE — ED Notes (Signed)
Assisted Estill Batten, RN with rectal temperature; visitors at bedside

## 2016-08-31 NOTE — ED Notes (Addendum)
Kelly Porter (niece) [phone]

## 2016-08-31 NOTE — Consult Note (Signed)
Referring Provider:  Linna Darner, MD ([facility]) Primary Care Physician:  Henrine Screws, MD Primary Gastroenterologist:  None (unassigned)  Reason for Consultation:  SBO, ?? Colonic mass  HPI: Kelly Porter is a 82 y.o. female admitted through the ER today with altered mental status, being brought from home.  Patient is confused and unable to give reliable history, which is therefore obtained from conversation with the referring physician.  Patient was apparently found at home on sofa with large amount of vomitus.  CT in ER showed dilated, fluid-filled small bowel loops which might suggest SBO or enteritis, as well as some thickening/narrowing in the proximal colon raising the question of a possible mass.  She has not had any further vomiting in the emergency room.  The patient apparently was admitted to [facility] several months ago because of "small bowel obstruction and colitis," without any colonoscopic evaluation being performed. In addition, there is a remote history of C. difficile colitis with possible partial intestinal resection at that time, no details available.       Past Medical History:  Diagnosis Date  . Anemia "always"  . Anxiety   . Arthritis    "back" (05/19/2015)  . Bowel obstruction   . Chronic lower back pain    "qd for the last month" (05/19/2015)  . History of blood transfusion 05/19/2015   "low HgB"  . Melanoma of face ([place])    "skin only; no other treatment"  . Pneumonia X 1    Past Surgical History:  Procedure Laterality Date  . ABDOMINAL EXPLORATION SURGERY     "thought it was her ovaries; found diverticulitis"  . MELANOMA EXCISION     face    Prior to Admission medications   Medication Sig Start Date End Date Taking? Authorizing Provider  acetaminophen (TYLENOL) 325 MG tablet Take 2 tablets (650 mg total) by mouth every 6 (six) hours as needed for mild pain (or Fever >/= 101). 06/17/16  Yes Dustin Flock, MD   acetaminophen (TYLENOL) 500 MG tablet Take 500 mg by mouth 2 (two) times daily.   Yes Historical Provider, MD  Cholecalciferol (VITAMIN D3) 1000 units CAPS Take 1,000 Units by mouth 2 (two) times daily.   Yes Historical Provider, MD  Cranberry 500 MG CHEW Chew 500 mg by mouth daily.   Yes Historical Provider, MD  feeding supplement, ENSURE ENLIVE, (ENSURE ENLIVE) LIQD Take 237 mLs by mouth daily.   Yes Historical Provider, MD  ferrous sulfate 325 (65 FE) MG tablet Take 325 mg by mouth daily with breakfast.   Yes Historical Provider, MD  furosemide (LASIX) 20 MG tablet Take 20 mg by mouth daily as needed for fluid or edema.   Yes Historical Provider, MD  metoprolol tartrate (LOPRESSOR) 25 MG tablet Take 1 tablet (25 mg total) by mouth 2 (two) times daily. 06/17/16  Yes Dustin Flock, MD  Multiple Vitamin (MULTIVITAMIN WITH MINERALS) TABS tablet Take 1 tablet by mouth daily.   Yes Historical Provider, MD  ondansetron (ZOFRAN) 4 MG tablet Take 1 tablet (4 mg total) by mouth every 6 (six) hours as needed for nausea. 06/17/16  Yes Dustin Flock, MD  pantoprazole (PROTONIX) 40 MG tablet Take 1 tablet (40 mg total) by mouth daily. 05/22/15  Yes [name], DO  traMADol (ULTRAM) 50 MG tablet Take 0.5 tablets (25 mg total) by mouth every 6 (six) hours as needed for moderate pain or severe pain. 06/17/16  Yes Dustin Flock, MD  vitamin C (  ASCORBIC ACID) 500 MG tablet Take 500 mg by mouth daily.   Yes Historical Provider, MD  cyclobenzaprine (FLEXERIL) 5 MG tablet Take 1 tablet (5 mg total) by mouth 3 (three) times daily as needed for muscle spasms. Patient not taking: Reported on 08/31/2016 05/22/15   [name], DO    Current Facility-Administered Medications  Medication Dose Route Frequency Provider Last Rate Last Dose  . 0.9 %  sodium chloride infusion  100 mL/hr Intravenous Continuous Nicole Pisciotta, PA-C      . acetaminophen (TYLENOL) tablet 650 mg  650 mg Oral Q6H PRN Waldemar Dickens, MD        Or  . acetaminophen (TYLENOL) suppository 650 mg  650 mg Rectal Q6H PRN Waldemar Dickens, MD      . dextrose 5 %-0.45 % sodium chloride infusion   Intravenous Continuous Waldemar Dickens, MD 50 mL/hr at 08/31/16 1735    . fentaNYL (SUBLIMAZE) injection 12.5 mcg  12.5 mcg Intravenous Q2H PRN Waldemar Dickens, MD      . heparin injection 5,000 Units  5,000 Units Subcutaneous Q8H Waldemar Dickens, MD      . metoprolol (LOPRESSOR) injection 2.5 mg  2.5 mg Intravenous Q5 min PRN Waldemar Dickens, MD      . ondansetron [facility]) tablet 4 mg  4 mg Oral Q6H PRN Waldemar Dickens, MD       Or  . ondansetron [facility]) injection 4 mg  4 mg Intravenous Q6H PRN Waldemar Dickens, MD      . piperacillin-tazobactam (ZOSYN) IVPB 2.25 g  2.25 g Intravenous Q6H Rachel L Rumbarger, RPH      . sodium chloride flush (NS) 0.9 % injection 3 mL  3 mL Intravenous Q12H Waldemar Dickens, MD      . traMADol Veatrice Bourbon) tablet 25 mg  25 mg Oral Q6H PRN Waldemar Dickens, MD      . Derrill Memo ON 09/01/2016] vancomycin (VANCOCIN) 500 mg in sodium chloride 0.9 % 100 mL IVPB  500 mg Intravenous Q24H Rachel L Rumbarger, RPH        Allergies as of 08/31/2016  . (No Known Allergies)    No family history on file.  Social History   Social History  . Marital status: Widowed    Spouse name: N/A  . Number of children: N/A  . Years of education: N/A   Occupational History  . Not on file.   Social History Main Topics  . Smoking status: Never Smoker  . Smokeless tobacco: Never Used  . Alcohol use No  . Drug use: No  . Sexual activity: No   Other Topics Concern  . Not on file   Social History Narrative  . No narrative on file    Review of Systems: Unobtainable, patient confused, no family at bedside  Physical Exam: Vital signs in last 24 hours: Temp:  [101.6 F (38.7 C)] 101.6 F (38.7 C) (11/14 1550) Pulse Rate:  [87-102] 87 (11/14 1630) Resp:  [17-31] 17 (11/14 1615) BP: (91-132)/(45-92) 100/45 (11/14 1630) SpO2:  [91 %-100  %] 100 % (11/14 1630) Weight:  [39 kg (86 lb)] 39 kg (86 lb) (11/14 1235)   General:  Extremely frail appearing elderly female, curled up in bed, no evident distress. Head:  Normocephalic and atraumatic. Eyes:  Sclera clear, no icterus.   Conjunctiva pink. Neck:   No masses or thyromegaly. Lungs:  Clear throughout to auscultation.   No wheezes, crackles, or rhonchi. No  evident respiratory distress. Heart:   Regular rate and rhythm; no murmurs, clicks, rubs,  or gallops. Abdomen:  Soft, nontender, nontympanitic, and nondistended. No masses, hepatosplenomegaly or ventral hernias noted. Normal bowel sounds, without bruits, guarding, or rebound.   Msk:   Symmetrical without gross deformities; patient is in fetal position, not clear if she is contractured. Extremities:   Without clubbing, cyanosis, or edema. Neurologic:  Alert, speech is clear, no obvious focal neurologic deficits. However, cognition is impaired. Patient does not know her age, or know where she is. Skin:  Intact without significant lesions or rashes, but with old ecchymoses on dorsum of hands and forearms.. Cervical Nodes:  No significant cervical adenopathy. Psych:   Normal mood and affect. No agitation or overt depression.  Intake/Output from previous day: No intake/output data recorded. Intake/Output this shift: Total I/O In: 550 [IV Piggyback:550] Out: -   Lab Results:  Recent Labs  08/31/16 1148 08/31/16 1159  WBC 5.8  --   HGB 9.7* 10.9*  HCT 31.3* 32.0*  PLT 305  --    BMET  Recent Labs  08/31/16 1148 08/31/16 1159  NA 135 136  K 4.6 4.4  CL 100* 101  CO2 21*  --   GLUCOSE 163* 162*  BUN 31* 30*  CREATININE 1.25* 1.00  CALCIUM 8.5*  --    LFT  Recent Labs  08/31/16 1148  PROT 5.4*  ALBUMIN 3.2*  AST 38  ALT 11*  ALKPHOS 69  BILITOT 0.7   PT/INR  Recent Labs  08/31/16 1148  LABPROT 14.2  INR 1.09    Studies/Results: Dg Chest 1 View  Result Date: 08/31/2016 CLINICAL DATA:   Chest pain EXAM: CHEST 1 VIEW COMPARISON:  07/13/2012 FINDINGS: Heart is normal size. Mediastinal contours are within normal limits. Calcifications within the aortic arch. Mild interstitial prominence throughout the lungs, new since prior study. No effusions or acute bony abnormality. IMPRESSION: New mild interstitial prominence throughout the lungs which could reflect chronic interstitial lung disease. Electronically Signed   By: Rolm Baptise M.D.   On: 08/31/2016 13:03   Ct Head Wo Contrast  Result Date: 08/31/2016 CLINICAL DATA:  Altered mental status EXAM: CT HEAD WITHOUT CONTRAST TECHNIQUE: Contiguous axial images were obtained from the base of the skull through the vertex without intravenous contrast. COMPARISON:  05/19/2015 FINDINGS: Brain: There is atrophy and chronic small vessel disease changes. No acute intracranial abnormality. Specifically, no hemorrhage, hydrocephalus, mass lesion, acute infarction, or significant intracranial injury. Vascular: No hyperdense vessel or unexpected calcification. Skull: No acute calvarial abnormality. Sinuses/Orbits: Visualized paranasal sinuses and mastoids clear. Orbital soft tissues unremarkable. Other: None IMPRESSION: No acute intracranial abnormality. Atrophy, chronic microvascular disease. Electronically Signed   By: Rolm Baptise M.D.   On: 08/31/2016 13:06   Ct Abdomen Pelvis W Contrast  Result Date: 08/31/2016 CLINICAL DATA:  82 year old with nausea, vomiting and pain. EXAM: CT ABDOMEN AND PELVIS WITH CONTRAST TECHNIQUE: Multidetector CT imaging of the abdomen and pelvis was performed using the standard protocol following bolus administration of intravenous contrast. CONTRAST:  43mL ISOVUE-300 IOPAMIDOL (ISOVUE-300) INJECTION 61% COMPARISON:  06/13/2016 FINDINGS: Lower chest: Mild dependent atelectasis at the lung bases. No large pleural effusions. Small-moderate sized hiatal hernia. Right pleural effusion has resolved from the previous examination.  Hepatobiliary: Small amount of perihepatic ascites. Otherwise, normal appearance of the liver, gallbladder and portal venous system is patent. No significant biliary dilatation. Pancreas: Normal appearance of the pancreas without inflammation or duct dilatation. Spleen: Normal appearance of spleen  without enlargement. Adrenals/Urinary Tract: Normal adrenal glands. Again noted is an extrarenal pelvis in both kidneys. Tiny cyst in the right kidney lower pole. There is no significant hydronephrosis. Limited evaluation of the urinary bladder due to artifact from the left hip arthroplasty. There is fluid in the urinary bladder. Probable small cyst in left kidney. Stomach/Bowel: Small to moderate sized hiatal hernia. Multiple diverticula involving the sigmoid colon. Dilated and fluid-filled loops of small bowel with wall enhancement. There are fluid-filled and mildly dilated loops of colon. The appendix is a mildly distended and fluid-filled, best seen on sequence 2, image 61. Appendix roughly measures 0.8 cm in diameter. No definite inflammatory changes around the appendix. Limited evaluation of the ileocecal valve. There is concern for mild focal narrowing of the right colon on sequence 2, image 50. Vascular/Lymphatic: Atherosclerotic calcifications in aorta without aneurysm. No significant lymph node enlargement in the abdomen or pelvis. Reproductive: Uterus is not confidently identified but this area is poorly characterized due to artifact from the left hip replacement. Other: Small amount of free fluid in the pelvis and right lower quadrant. Small amount of fluid around the liver. Musculoskeletal: Left hip arthroplasty is located. Disc space narrowing along the anterior aspect of L1-L2. IMPRESSION: Again noted are dilating fluid-filled loops of distal small bowel and colon. A transition point is not clearly identified and findings may be associated with an ileus. Again noted is a small amount of abdominal and pelvic  ascites. There is wall enhancement of the distal small bowel and enteritis cannot be excluded. Focal narrowing and wall thickening in the right colon. This colonic narrowing is more prominent than the previous examination and this could represent focal peristalsis. Colonic lesion in this area cannot be excluded. Hiatal hernia. Electronically Signed   By: Markus Daft M.D.   On: 08/31/2016 13:25    Impression: 1. Dilated, small bowel loops with recent emesis. History and findings compatible either with infectious enteritis or partial small bowel obstruction. 2. Localized, persistent colonic thickening, could conceivably represent a proximal colonic neoplasm which might in turn account for the patient's dilated small bowel loops. 3. Recent "colitis" during hospitalization in [place] several months ago 4. Remote history of C. difficile colitis with possible resection at that time 5. Cognitive dysfunction and overall frail health  Plan: 1. I agree with Dr. Marily Memos that NG suction is not needed, in the absence of obvious gastric distention or vomiting at the present time. 2. The patient's CT scan shows a fair amount of distal fecal retention. I would advocate working from below with enemas, since the patient is a poor candidate for laxatives given her possible small bowel obstruction. Accordingly, I have ordered 2 Fleet enemas. 3. Depending on the patient's clinical evolution, if we are successful in cleaning out the lower portion of the colon with enemas, it might be possible to do a barium enema to try to discern whether there is truly a mass in the proximal colon, after which a conversation could be held with the patient's family to make decisions about how to proceed from there.   LOS: 0 days   Jearl Soto V  08/31/2016, 5:36 PM   Pager [phone] If no answer or after 5 PM call [phone]

## 2016-08-31 NOTE — ED Notes (Signed)
I&O cath performed with Su Grand

## 2016-08-31 NOTE — H&P (Signed)
History and Physical    Kelly Porter [id number] DOB: 1919/10/05 DOA: 08/31/2016  PCP: Henrine Screws, MD Patient coming from: home  Chief Complaint: AMS  HPI: Kelly Porter is a 82 y.o. female with medical history significant of   Level V caveat: Patient unable to provide history given her acute illness and altered mental state. History provided by EMS report, patient's niece, and EDP. Per report home health aide arrived on the patient's [place] today to take care of her and noticed that she was confused sitting on a couch covered in emesis and fecal matter. Prior to this patient was stated to be in her normal state of health prior to current event. Lives at home and performs most of her ADLs.   No recently reported fevers, cough, chest pain, abdominal pain, nausea, vomiting, diarrhea, dysuria, frequency, back pain, falls, LOC.  Slurred speech per patient's niece who knows her very well. No other focal neurological deficit noted. There are reported dark hard stools which are patient's baseline.  Per review of history patient contracted C. difficile colitis several years ago and had an unknown abdominal surgery performed per family. Suspicion is that she had part of her colon removed. Patient developed small bowel obstruction and colitis 3 months ago and was admitted to [facility]. At that time noted scope was performed. Family unsure when patient's last colonoscopy was.  Patient with some baseline intermittent confusion. Worse since admission to [facility].  ED Course: Blood culture drawn, started on vancomycin and Zosyn and given 2000 mL fluid bolus. Objective findings outlined below.  Review of Systems: As per HPI otherwise 10 point review of systems negative.   Ambulatory Status: Few restrictions. Takes care of self.  Past Medical History:  Diagnosis Date  . Anemia "always"  . Anxiety   . Arthritis    "back" (05/19/2015)  . Bowel obstruction   . Chronic lower back pain    "qd for  the last month" (05/19/2015)  . History of blood transfusion 05/19/2015   "low HgB"  . Melanoma of face ([place])    "skin only; no other treatment"  . Pneumonia X 1    Past Surgical History:  Procedure Laterality Date  . ABDOMINAL EXPLORATION SURGERY     "thought it was her ovaries; found diverticulitis"  . MELANOMA EXCISION     face    Social History   Social History  . Marital status: Widowed    Spouse name: N/A  . Number of children: N/A  . Years of education: N/A   Occupational History  . Not on file.   Social History Main Topics  . Smoking status: Never Smoker  . Smokeless tobacco: Never Used  . Alcohol use No  . Drug use: No  . Sexual activity: No   Other Topics Concern  . Not on file   Social History Narrative  . No narrative on file    No Known Allergies  No family history on file.  Prior to Admission medications   Medication Sig Start Date End Date Taking? Authorizing Provider  acetaminophen (TYLENOL) 325 MG tablet Take 2 tablets (650 mg total) by mouth every 6 (six) hours as needed for mild pain (or Fever >/= 101). 06/17/16   Dustin Flock, MD  Cholecalciferol (VITAMIN D3) 1000 units CAPS Take 1,000 Units by mouth 2 (two) times daily.    Historical Provider, MD  Cranberry 500 MG CHEW Chew 500 mg by mouth daily.    Historical Provider, MD  cyclobenzaprine (FLEXERIL)  5 MG tablet Take 1 tablet (5 mg total) by mouth 3 (three) times daily as needed for muscle spasms. Patient not taking: Reported on 06/13/2016 05/22/15   [name], DO  ferrous sulfate 325 (65 FE) MG tablet Take 325 mg by mouth daily with breakfast.    Historical Provider, MD  metoprolol tartrate (LOPRESSOR) 25 MG tablet Take 1 tablet (25 mg total) by mouth 2 (two) times daily. 06/17/16   Dustin Flock, MD  Multiple Vitamins-Minerals (MULTIVITAMIN ADULT PO) Take 1 tablet by mouth daily.    Historical Provider, MD  ondansetron (ZOFRAN) 4 MG tablet Take 1 tablet (4 mg total) by mouth every 6 (six)  hours as needed for nausea. 06/17/16   Dustin Flock, MD  pantoprazole (PROTONIX) 40 MG tablet Take 1 tablet (40 mg total) by mouth daily. Patient not taking: Reported on 06/13/2016 05/22/15   [name], DO  polyethylene glycol [facility] / Floria Raveling) packet Take 17 g by mouth daily as needed for moderate constipation.    Historical Provider, MD  traMADol (ULTRAM) 50 MG tablet Take 0.5 tablets (25 mg total) by mouth every 6 (six) hours as needed for moderate pain or severe pain. 06/17/16   Dustin Flock, MD  vitamin C (ASCORBIC ACID) 500 MG tablet Take 500 mg by mouth daily.    Historical Provider, MD    Physical Exam: Vitals:   08/31/16 1345 08/31/16 1400 08/31/16 1415 08/31/16 1430  BP: 132/92 108/56 (!) 104/53 (!) 104/51  Pulse: 97 95 96 96  Resp: (!) 31 25 24    Temp:      TempSrc:      SpO2: 99% 100% 99% 100%  Weight:      Height:         General: Cachectic, resting in bed Eyes:  PERRL, EOMI, normal lids, iris ENT: very dry mm, hard of hearing.  Neck:  no LAD, masses or thyromegaly Cardiovascular: faint heart sounds. RRR, III/VI. No LE edema.  Respiratory:  CTA bilaterally, no w/r/r. Normal respiratory effort. Abdomen: hypoactive BS, non-distended, soft, mild tt to deep palpation.  Skin:  no rash or induration seen on limited exam Musculoskeletal:  grossly normal tone BUE/BLE, good ROM, no bony abnormality Psychiatric: Pleasant. Attempts to answer questions when asked. Follows commands. Confused. Alert and oriented 1 Neurologic: slurred speech (very dry tongue), no facial droop. 5/5 upper extremity strength. moves all extremities in coordinated fashion, sensation intact  Labs on Admission: I have personally reviewed following labs and imaging studies  CBC:  Recent Labs Lab 08/31/16 1148 08/31/16 1159  WBC 5.8  --   NEUTROABS 5.1  --   HGB 9.7* 10.9*  HCT 31.3* 32.0*  MCV 87.7  --   PLT 305  --    Basic Metabolic Panel:  Recent Labs Lab 08/31/16 1148  08/31/16 1159  NA 135 136  K 4.6 4.4  CL 100* 101  CO2 21*  --   GLUCOSE 163* 162*  BUN 31* 30*  CREATININE 1.25* 1.00  CALCIUM 8.5*  --    GFR: Estimated Creatinine Clearance: 20.7 mL/min (by C-G formula based on SCr of 1 mg/dL). Liver Function Tests:  Recent Labs Lab 08/31/16 1148  AST 38  ALT 11*  ALKPHOS 69  BILITOT 0.7  PROT 5.4*  ALBUMIN 3.2*   No results for input(s): LIPASE, AMYLASE in the last 168 hours. No results for input(s): AMMONIA in the last 168 hours. Coagulation Profile:  Recent Labs Lab 08/31/16 1148  INR 1.09  Cardiac Enzymes: No results for input(s): CKTOTAL, CKMB, CKMBINDEX, TROPONINI in the last 168 hours. BNP (last 3 results) No results for input(s): PROBNP in the last 8760 hours. HbA1C: No results for input(s): HGBA1C in the last 72 hours. CBG: No results for input(s): GLUCAP in the last 168 hours. Lipid Profile: No results for input(s): CHOL, HDL, LDLCALC, TRIG, CHOLHDL, LDLDIRECT in the last 72 hours. Thyroid Function Tests: No results for input(s): TSH, T4TOTAL, FREET4, T3FREE, THYROIDAB in the last 72 hours. Anemia Panel: No results for input(s): VITAMINB12, FOLATE, FERRITIN, TIBC, IRON, RETICCTPCT in the last 72 hours. Urine analysis:    Component Value Date/Time   COLORURINE YELLOW (A) 06/13/2016 1319   APPEARANCEUR CLEAR (A) 06/13/2016 1319   LABSPEC 1.017 06/13/2016 1319   PHURINE 5.0 06/13/2016 1319   GLUCOSEU NEGATIVE 06/13/2016 1319   HGBUR NEGATIVE 06/13/2016 1319   BILIRUBINUR NEGATIVE 06/13/2016 1319   KETONESUR NEGATIVE 06/13/2016 1319   PROTEINUR NEGATIVE 06/13/2016 1319   UROBILINOGEN 0.2 05/19/2015 1856   NITRITE NEGATIVE 06/13/2016 1319   LEUKOCYTESUR NEGATIVE 06/13/2016 1319    Creatinine Clearance: Estimated Creatinine Clearance: 20.7 mL/min (by C-G formula based on SCr of 1 mg/dL).  Sepsis Labs: @LABRCNTIP (procalcitonin:4,lacticidven:4) )No results found for this or any previous visit (from the past  240 hour(s)).   Radiological Exams on Admission: Dg Chest 1 View  Result Date: 08/31/2016 CLINICAL DATA:  Chest pain EXAM: CHEST 1 VIEW COMPARISON:  07/13/2012 FINDINGS: Heart is normal size. Mediastinal contours are within normal limits. Calcifications within the aortic arch. Mild interstitial prominence throughout the lungs, new since prior study. No effusions or acute bony abnormality. IMPRESSION: New mild interstitial prominence throughout the lungs which could reflect chronic interstitial lung disease. Electronically Signed   By: Rolm Baptise M.D.   On: 08/31/2016 13:03   Ct Head Wo Contrast  Result Date: 08/31/2016 CLINICAL DATA:  Altered mental status EXAM: CT HEAD WITHOUT CONTRAST TECHNIQUE: Contiguous axial images were obtained from the base of the skull through the vertex without intravenous contrast. COMPARISON:  05/19/2015 FINDINGS: Brain: There is atrophy and chronic small vessel disease changes. No acute intracranial abnormality. Specifically, no hemorrhage, hydrocephalus, mass lesion, acute infarction, or significant intracranial injury. Vascular: No hyperdense vessel or unexpected calcification. Skull: No acute calvarial abnormality. Sinuses/Orbits: Visualized paranasal sinuses and mastoids clear. Orbital soft tissues unremarkable. Other: None IMPRESSION: No acute intracranial abnormality. Atrophy, chronic microvascular disease. Electronically Signed   By: Rolm Baptise M.D.   On: 08/31/2016 13:06   Ct Abdomen Pelvis W Contrast  Result Date: 08/31/2016 CLINICAL DATA:  82 year old with nausea, vomiting and pain. EXAM: CT ABDOMEN AND PELVIS WITH CONTRAST TECHNIQUE: Multidetector CT imaging of the abdomen and pelvis was performed using the standard protocol following bolus administration of intravenous contrast. CONTRAST:  52mL ISOVUE-300 IOPAMIDOL (ISOVUE-300) INJECTION 61% COMPARISON:  06/13/2016 FINDINGS: Lower chest: Mild dependent atelectasis at the lung bases. No large pleural  effusions. Small-moderate sized hiatal hernia. Right pleural effusion has resolved from the previous examination. Hepatobiliary: Small amount of perihepatic ascites. Otherwise, normal appearance of the liver, gallbladder and portal venous system is patent. No significant biliary dilatation. Pancreas: Normal appearance of the pancreas without inflammation or duct dilatation. Spleen: Normal appearance of spleen without enlargement. Adrenals/Urinary Tract: Normal adrenal glands. Again noted is an extrarenal pelvis in both kidneys. Tiny cyst in the right kidney lower pole. There is no significant hydronephrosis. Limited evaluation of the urinary bladder due to artifact from the left hip arthroplasty. There is fluid in the  urinary bladder. Probable small cyst in left kidney. Stomach/Bowel: Small to moderate sized hiatal hernia. Multiple diverticula involving the sigmoid colon. Dilated and fluid-filled loops of small bowel with wall enhancement. There are fluid-filled and mildly dilated loops of colon. The appendix is a mildly distended and fluid-filled, best seen on sequence 2, image 61. Appendix roughly measures 0.8 cm in diameter. No definite inflammatory changes around the appendix. Limited evaluation of the ileocecal valve. There is concern for mild focal narrowing of the right colon on sequence 2, image 50. Vascular/Lymphatic: Atherosclerotic calcifications in aorta without aneurysm. No significant lymph node enlargement in the abdomen or pelvis. Reproductive: Uterus is not confidently identified but this area is poorly characterized due to artifact from the left hip replacement. Other: Small amount of free fluid in the pelvis and right lower quadrant. Small amount of fluid around the liver. Musculoskeletal: Left hip arthroplasty is located. Disc space narrowing along the anterior aspect of L1-L2. IMPRESSION: Again noted are dilating fluid-filled loops of distal small bowel and colon. A transition point is not  clearly identified and findings may be associated with an ileus. Again noted is a small amount of abdominal and pelvic ascites. There is wall enhancement of the distal small bowel and enteritis cannot be excluded. Focal narrowing and wall thickening in the right colon. This colonic narrowing is more prominent than the previous examination and this could represent focal peristalsis. Colonic lesion in this area cannot be excluded. Hiatal hernia. Electronically Signed   By: Markus Daft M.D.   On: 08/31/2016 13:25    EKG: Independently reviewed. Sinus, no ACS, non-specifice T wave changes  Assessment/Plan Active Problems:   Severe sepsis with septic shock (HCC)   Sepsis (HCC)   Bowel obstruction   AKI (acute kidney injury) ([place])   Slurred speech   Hypertension   Chronic pain    Sepsis/AMS: unknown source. UA pending. Possibly GI. Lactic acid 4.08 (downtrending to 3.14 on repeat), hypotensive, tachycardic, temperature 101.6, tachypnea, acutely encephalopathic. - Follow-up BCX, UCX (second BCX and UCX obtained after ABX) - Continue vancomycin and Zosyn - UA - IVF - Sepsis order set utilized - Procalcitonin  Slurred speech: associated w/ AMS. No focal neurological deficit. Tongue extremely dry and suspect this may be contributing - MRI brain - sips - Treatment as above  Bowel obstruction: as evidenced by CT scan as above. Cannot r/o colonic mass. H/o same in past. Last colonoscopy - unknown. H/o cdiff colitis w/ possible resection at [facility] several years ago. No records in care everywhere. Admission to [facility] for colitis and SBO 05/2016. Constipated at baseline. Dark formed stools at baseline. On iron supplementation.  - GI consult - [facility] - NPO - NG tube if emesis resumes  AKI: Cr 1.25. Baseline 0.8. Likely from GI loss and poor oral intake from SBO and sepsis. - IVF - BMET in am  HTN: low to normotensive  - lopressor PRN - resume metop when no longer acutely ill and taking  orals  Chronic pain:  - low dose fentanyl IV PRN (on tramadol PO at home)  DVT prophylaxis: Hep  Code Status: DNR  Family Communication: Neice  Disposition Plan: pending improvement in acute illness and workup for possible colonic stricture/cancer  Consults called: GI  Admission status: inpt    Paullette Mckain J MD [facility]  If 7PM-7AM, please contact night-coverage [email or web address] Password [facility]  08/31/2016, 3:47 PM

## 2016-08-31 NOTE — ED Notes (Signed)
Patient has returned from being out of the department; patient placed back on monitor, continuous pulse oximetry and blood pressure cuff; visitor at bedside 

## 2016-08-31 NOTE — Progress Notes (Signed)
Pharmacy Antibiotic Note  Kelly Porter is a 82 y.o. female admitted on 08/31/2016 with sepsis.  Pharmacy has been consulted for vancomycin and zosyn dosing. Tmax is 101.6 and WBC is WNL. SCr is WNL and lactic acid is elevated at 4.08.   Plan: - Vancomycin 750mg  IV x 1 then 500mg  IV Q24H - Zosyn 3.375gm IV x 1 then 2.25gm IV Q6H - F/u renal fxn, C&S, clinical status and trough at SS  Height: 5\' 2"  (157.5 cm) Weight: 86 lb (39 kg) IBW/kg (Calculated) : 50.1  Temp (24hrs), Avg:101.6 F (38.7 C), Min:101.6 F (38.7 C), Max:101.6 F (38.7 C)   Recent Labs Lab 08/31/16 1148 08/31/16 1159 08/31/16 1200  WBC 5.8  --   --   CREATININE 1.25* 1.00  --   LATICACIDVEN  --   --  4.08*    Estimated Creatinine Clearance: 20.7 mL/min (by C-G formula based on SCr of 1 mg/dL).    No Known Allergies  Antimicrobials this admission: Vanc 11/14>> Zosyn 11/14>>  Dose adjustments this admission: N/A  Microbiology results: Pending  Thank you for allowing pharmacy to be a part of this patient's care.  Kelly Porter, Rande Lawman 08/31/2016 1:03 PM

## 2016-08-31 NOTE — ED Notes (Signed)
Patient still currently at CT/xray

## 2016-08-31 NOTE — ED Notes (Signed)
This RN attempted x2 for IV access unsuccessful.

## 2016-08-31 NOTE — ED Provider Notes (Signed)
[facility] DEPT Provider Note   CSN: [id number] Arrival date & time: 08/31/16  1042     History   Chief Complaint Chief Complaint  Patient presents with  . Abdominal Pain  . Altered Mental Status  . Emesis  . Aphasia   Remainder of history, ROS, and physical exam limited due to patient's condition (AMS). Additional information was obtained from either EMS or family.   Level V Caveat.   HPI Kelly Porter is a 82 y.o. female.  HPI Patient admits a 82 year old high functioning female who lives at home and was provided with home health who checks in on her on a daily basis presents to the ED after being found after more than 24 hours since last check on the patient. She was found in a chair with emesis all over her and altered. Upon EMS arrival patient was alert and confused thought to have slurred speech and possible facial droop. No other focal deficits noted. Patient was initially tachycardic which improved with IV fluid bolus. She remained stable in route.  On arrival patient is alert however confused. Patient's nephew was at bedside however unable to provide any significant history within the last 24-48 hours. Per family though, the patient does go to intermittent state of confusion. They also mentioned that the patient was recently admitted for small bowel obstruction that did not require surgery.  Past Medical History:  Diagnosis Date  . Anemia "always"  . Anxiety   . Arthritis    "back" (05/19/2015)  . Bowel obstruction   . Chronic lower back pain    "qd for the last month" (05/19/2015)  . History of blood transfusion 05/19/2015   "low HgB"  . Melanoma of face ([place])    "skin only; no other treatment"  . Pneumonia X 1    Patient Active Problem List   Diagnosis Date Noted  . Severe sepsis with septic shock ([place]) 08/31/2016  . Ileus ([place]) 06/13/2016  . Back pain   . Iron deficiency anemia due to chronic blood loss   . Fall (on)(from) incline, initial encounter     . Chronic GI bleeding   . Syncope 05/19/2015    Past Surgical History:  Procedure Laterality Date  . ABDOMINAL EXPLORATION SURGERY     "thought it was her ovaries; found diverticulitis"  . MELANOMA EXCISION     face    OB History    No data available       Home Medications    Prior to Admission medications   Medication Sig Start Date End Date Taking? Authorizing Provider  acetaminophen (TYLENOL) 325 MG tablet Take 2 tablets (650 mg total) by mouth every 6 (six) hours as needed for mild pain (or Fever >/= 101). 06/17/16   Dustin Flock, MD  Cholecalciferol (VITAMIN D3) 1000 units CAPS Take 1,000 Units by mouth 2 (two) times daily.    Historical Provider, MD  Cranberry 500 MG CHEW Chew 500 mg by mouth daily.    Historical Provider, MD  cyclobenzaprine (FLEXERIL) 5 MG tablet Take 1 tablet (5 mg total) by mouth 3 (three) times daily as needed for muscle spasms. Patient not taking: Reported on 06/13/2016 05/22/15   [name], DO  ferrous sulfate 325 (65 FE) MG tablet Take 325 mg by mouth daily with breakfast.    Historical Provider, MD  metoprolol tartrate (LOPRESSOR) 25 MG tablet Take 1 tablet (25 mg total) by mouth 2 (two) times daily. 06/17/16   Dustin Flock, MD  Multiple  Vitamins-Minerals (MULTIVITAMIN ADULT PO) Take 1 tablet by mouth daily.    Historical Provider, MD  ondansetron (ZOFRAN) 4 MG tablet Take 1 tablet (4 mg total) by mouth every 6 (six) hours as needed for nausea. 06/17/16   Dustin Flock, MD  pantoprazole (PROTONIX) 40 MG tablet Take 1 tablet (40 mg total) by mouth daily. Patient not taking: Reported on 06/13/2016 05/22/15   [name], DO  polyethylene glycol [facility]) packet Take 17 g by mouth daily as needed for moderate constipation.    Historical Provider, MD  traMADol (ULTRAM) 50 MG tablet Take 0.5 tablets (25 mg total) by mouth every 6 (six) hours as needed for moderate pain or severe pain. 06/17/16   Dustin Flock, MD  vitamin C (ASCORBIC ACID)  500 MG tablet Take 500 mg by mouth daily.    Historical Provider, MD    Family History No family history on file.  Social History Social History  Substance Use Topics  . Smoking status: Never Smoker  . Smokeless tobacco: Never Used  . Alcohol use No     Allergies   Patient has no known allergies.   Review of Systems Review of Systems  Unable to perform ROS: Mental status change     Physical Exam Updated Vital Signs BP 91/75 (BP Location: Left Arm)   Pulse 99   Temp 101.6 F (38.7 C) (Rectal)   Resp 18   Ht 5\' 2"  (1.575 m)   Wt 86 lb (39 kg)   SpO2 91%   BMI 15.73 kg/m   Physical Exam  Constitutional: She appears well-developed and well-nourished. No distress.  HENT:  Head: Normocephalic and atraumatic.  Nose: Nose normal.  Mouth/Throat: Mucous membranes are dry.  Eyes: Conjunctivae and EOM are normal. Pupils are equal, round, and reactive to light. Right eye exhibits no discharge. Left eye exhibits no discharge. No scleral icterus.  Neck: Normal range of motion. Neck supple.  Cardiovascular: Normal rate and regular rhythm.  Exam reveals no gallop and no friction rub.   No murmur heard. Pulmonary/Chest: Effort normal and breath sounds normal. No stridor. No respiratory distress. She has no rales.  Abdominal: Soft. She exhibits distension. There is generalized tenderness. There is no rigidity, no rebound and no guarding.  Musculoskeletal: She exhibits no edema or tenderness.  Neurological: She is alert. She is disoriented.  Limited due AMS. Speech is slurred. No facial droop noted. Moves all extremities. Localizes to pain.  Skin: Skin is warm and dry. No rash noted. She is not diaphoretic. No erythema.  Psychiatric: She has a normal mood and affect.  Vitals reviewed.    ED Treatments / Results  Labs (all labs ordered are listed, but only abnormal results are displayed) Labs Reviewed  CBC - Abnormal; Notable for the following:       Result Value   RBC  3.57 (*)    Hemoglobin 9.7 (*)    HCT 31.3 (*)    All other components within normal limits  DIFFERENTIAL - Abnormal; Notable for the following:    Lymphs Abs 0.3 (*)    All other components within normal limits  COMPREHENSIVE METABOLIC PANEL - Abnormal; Notable for the following:    Chloride 100 (*)    CO2 21 (*)    Glucose, Bld 163 (*)    BUN 31 (*)    Creatinine, Ser 1.25 (*)    Calcium 8.5 (*)    Total Protein 5.4 (*)    Albumin 3.2 (*)  ALT 11 (*)    GFR calc non Af Amer 35 (*)    GFR calc Af Amer 41 (*)    All other components within normal limits  I-STAT CHEM 8, ED - Abnormal; Notable for the following:    BUN 30 (*)    Glucose, Bld 162 (*)    Calcium, Ion 1.02 (*)    Hemoglobin 10.9 (*)    HCT 32.0 (*)    All other components within normal limits  I-STAT CG4 LACTIC ACID, ED - Abnormal; Notable for the following:    Lactic Acid, Venous 4.08 (*)    All other components within normal limits  CULTURE, BLOOD (ROUTINE X 2)  CULTURE, BLOOD (ROUTINE X 2)  ETHANOL  PROTIME-INR  APTT  RAPID URINE DRUG SCREEN, HOSP PERFORMED  URINALYSIS, ROUTINE W REFLEX MICROSCOPIC (NOT AT [facility])  I-STAT TROPOININ, ED  I-STAT CG4 LACTIC ACID, ED    EKG  EKG Interpretation  Date/Time:  Tuesday August 31 2016 12:21:59 EST Ventricular Rate:  94 PR Interval:    QRS Duration: 106 QT Interval:  420 QTC Calculation: 526 R Axis:   -12 Text Interpretation:  Sinus rhythm Low voltage, precordial leads Nonspecific T abnormalities, lateral leads Prolonged QT interval Artifact in lead(s) I III aVR aVL aVF V1 V2 V6 No significant change since last tracing Confirmed by [facility] MD, Mercedez Boule ([id number]) on 08/31/2016 12:34:28 PM       Radiology Dg Chest 1 View  Result Date: 08/31/2016 CLINICAL DATA:  Chest pain EXAM: CHEST 1 VIEW COMPARISON:  07/13/2012 FINDINGS: Heart is normal size. Mediastinal contours are within normal limits. Calcifications within the aortic arch. Mild interstitial prominence  throughout the lungs, new since prior study. No effusions or acute bony abnormality. IMPRESSION: New mild interstitial prominence throughout the lungs which could reflect chronic interstitial lung disease. Electronically Signed   By: Rolm Baptise M.D.   On: 08/31/2016 13:03   Ct Head Wo Contrast  Result Date: 08/31/2016 CLINICAL DATA:  Altered mental status EXAM: CT HEAD WITHOUT CONTRAST TECHNIQUE: Contiguous axial images were obtained from the base of the skull through the vertex without intravenous contrast. COMPARISON:  05/19/2015 FINDINGS: Brain: There is atrophy and chronic small vessel disease changes. No acute intracranial abnormality. Specifically, no hemorrhage, hydrocephalus, mass lesion, acute infarction, or significant intracranial injury. Vascular: No hyperdense vessel or unexpected calcification. Skull: No acute calvarial abnormality. Sinuses/Orbits: Visualized paranasal sinuses and mastoids clear. Orbital soft tissues unremarkable. Other: None IMPRESSION: No acute intracranial abnormality. Atrophy, chronic microvascular disease. Electronically Signed   By: Rolm Baptise M.D.   On: 08/31/2016 13:06   Ct Abdomen Pelvis W Contrast  Result Date: 08/31/2016 CLINICAL DATA:  82 year old with nausea, vomiting and pain. EXAM: CT ABDOMEN AND PELVIS WITH CONTRAST TECHNIQUE: Multidetector CT imaging of the abdomen and pelvis was performed using the standard protocol following bolus administration of intravenous contrast. CONTRAST:  63mL ISOVUE-300 IOPAMIDOL (ISOVUE-300) INJECTION 61% COMPARISON:  06/13/2016 FINDINGS: Lower chest: Mild dependent atelectasis at the lung bases. No large pleural effusions. Small-moderate sized hiatal hernia. Right pleural effusion has resolved from the previous examination. Hepatobiliary: Small amount of perihepatic ascites. Otherwise, normal appearance of the liver, gallbladder and portal venous system is patent. No significant biliary dilatation. Pancreas: Normal  appearance of the pancreas without inflammation or duct dilatation. Spleen: Normal appearance of spleen without enlargement. Adrenals/Urinary Tract: Normal adrenal glands. Again noted is an extrarenal pelvis in both kidneys. Tiny cyst in the right kidney lower pole. There is no significant  hydronephrosis. Limited evaluation of the urinary bladder due to artifact from the left hip arthroplasty. There is fluid in the urinary bladder. Probable small cyst in left kidney. Stomach/Bowel: Small to moderate sized hiatal hernia. Multiple diverticula involving the sigmoid colon. Dilated and fluid-filled loops of small bowel with wall enhancement. There are fluid-filled and mildly dilated loops of colon. The appendix is a mildly distended and fluid-filled, best seen on sequence 2, image 61. Appendix roughly measures 0.8 cm in diameter. No definite inflammatory changes around the appendix. Limited evaluation of the ileocecal valve. There is concern for mild focal narrowing of the right colon on sequence 2, image 50. Vascular/Lymphatic: Atherosclerotic calcifications in aorta without aneurysm. No significant lymph node enlargement in the abdomen or pelvis. Reproductive: Uterus is not confidently identified but this area is poorly characterized due to artifact from the left hip replacement. Other: Small amount of free fluid in the pelvis and right lower quadrant. Small amount of fluid around the liver. Musculoskeletal: Left hip arthroplasty is located. Disc space narrowing along the anterior aspect of L1-L2. IMPRESSION: Again noted are dilating fluid-filled loops of distal small bowel and colon. A transition point is not clearly identified and findings may be associated with an ileus. Again noted is a small amount of abdominal and pelvic ascites. There is wall enhancement of the distal small bowel and enteritis cannot be excluded. Focal narrowing and wall thickening in the right colon. This colonic narrowing is more prominent  than the previous examination and this could represent focal peristalsis. Colonic lesion in this area cannot be excluded. Hiatal hernia. Electronically Signed   By: Markus Daft M.D.   On: 08/31/2016 13:25    Procedures Procedures (including critical care time) CRITICAL CARE Performed by: Grayce Sessions Kiarrah Rausch Total critical care time: 30 minutes Critical care time was exclusive of separately billable procedures and treating other patients. Critical care was necessary to treat or prevent imminent or life-threatening deterioration. Critical care was time spent personally by me on the following activities: development of treatment plan with patient and/or surrogate as well as nursing, discussions with consultants, evaluation of patient's response to treatment, examination of patient, obtaining history from patient or surrogate, ordering and performing treatments and interventions, ordering and review of laboratory studies, ordering and review of radiographic studies, pulse oximetry and re-evaluation of patient's condition.   Medications Ordered in ED Medications  sodium chloride 0.9 % bolus 500 mL (0 mLs Intravenous Stopped 08/31/16 1230)    Followed by  0.9 %  sodium chloride infusion (not administered)  vancomycin (VANCOCIN) IVPB 750 mg/150 ml premix (750 mg Intravenous Given 08/31/16 1350)  vancomycin (VANCOCIN) 500 mg in sodium chloride 0.9 % 100 mL IVPB (not administered)  piperacillin-tazobactam (ZOSYN) IVPB 2.25 g (not administered)  iopamidol (ISOVUE-300) 61 % injection (80 mLs  Contrast Given 08/31/16 1234)  sodium chloride 0.9 % bolus 1,000 mL (1,000 mLs Intravenous New Bag/Given 08/31/16 1320)    And  sodium chloride 0.9 % bolus 500 mL (500 mLs Intravenous New Bag/Given 08/31/16 1351)  piperacillin-tazobactam (ZOSYN) IVPB 3.375 g (0 g Intravenous Stopped 08/31/16 1357)     Initial Impression / Assessment and Plan / ED Course  I have reviewed the triage vital signs and the nursing  notes.  Pertinent labs & imaging results that were available during my care of the patient were reviewed by me and considered in my medical decision making (see chart for details).  Clinical Course     Patient noted to be febrile. Lactic  acid greater than 4. Code sepsis initiated and patient started on 30 mL/kg of IV fluids and empiric antibiotics. CT head without acute process. CT of the abdomen did reveal evidence of enteritis with dilated loops of bowel. Currently pending on the UA however no other sources of infection. Altered mental status likely secondary to infectious processes. Low suspicion for stroke at this time.  Discussed case with hospitalist who will admit the patient to stepdown unit for further management.  Final Clinical Impressions(s) / ED Diagnoses   Final diagnoses:  Slurred speech  Enteritis  Septic shock ([place])    Disposition: Admit  Condition: serious    Fatima Blank, MD 08/31/16 1722

## 2016-08-31 NOTE — ED Triage Notes (Signed)
Pt arrived via EMS from home. Pt arrived with altered mental status, slurred speech, and left sided facial droop. LSN over 24 hours; per EMS caretaker comes to see pt 4 days a week and did not see pt yesterday. Pt found today "sitting on couch, covered in puke and fecal incontinence." Pt received 100 cc bolus in route. Resp e/u; NAD noted at this time.

## 2016-08-31 NOTE — ED Notes (Signed)
Patient at CT

## 2016-09-01 ENCOUNTER — Inpatient Hospital Stay (HOSPITAL_COMMUNITY): Payer: Medicare Other

## 2016-09-01 DIAGNOSIS — I10 Essential (primary) hypertension: Secondary | ICD-10-CM

## 2016-09-01 DIAGNOSIS — N179 Acute kidney failure, unspecified: Secondary | ICD-10-CM

## 2016-09-01 DIAGNOSIS — G894 Chronic pain syndrome: Secondary | ICD-10-CM

## 2016-09-01 DIAGNOSIS — D649 Anemia, unspecified: Secondary | ICD-10-CM

## 2016-09-01 DIAGNOSIS — A419 Sepsis, unspecified organism: Principal | ICD-10-CM

## 2016-09-01 DIAGNOSIS — K529 Noninfective gastroenteritis and colitis, unspecified: Secondary | ICD-10-CM

## 2016-09-01 LAB — CBC
HCT: 23.5 % — ABNORMAL LOW (ref 36.0–46.0)
Hemoglobin: 7.1 g/dL — ABNORMAL LOW (ref 12.0–15.0)
MCH: 26.7 pg (ref 26.0–34.0)
MCHC: 30.2 g/dL (ref 30.0–36.0)
MCV: 88.3 fL (ref 78.0–100.0)
PLATELETS: 263 10*3/uL (ref 150–400)
RBC: 2.66 MIL/uL — ABNORMAL LOW (ref 3.87–5.11)
RDW: 14.6 % (ref 11.5–15.5)
WBC: 7.3 10*3/uL (ref 4.0–10.5)

## 2016-09-01 LAB — RETICULOCYTES
RBC.: 2.78 MIL/uL — ABNORMAL LOW (ref 3.87–5.11)
RETIC CT PCT: 1.3 % (ref 0.4–3.1)
Retic Count, Absolute: 36.1 10*3/uL (ref 19.0–186.0)

## 2016-09-01 LAB — IRON AND TIBC: TIBC: 224 ug/dL — AB (ref 250–450)

## 2016-09-01 LAB — COMPREHENSIVE METABOLIC PANEL
ALT: 10 U/L — ABNORMAL LOW (ref 14–54)
ANION GAP: 8 (ref 5–15)
AST: 19 U/L (ref 15–41)
Albumin: 2.3 g/dL — ABNORMAL LOW (ref 3.5–5.0)
Alkaline Phosphatase: 49 U/L (ref 38–126)
BUN: 33 mg/dL — ABNORMAL HIGH (ref 6–20)
CHLORIDE: 110 mmol/L (ref 101–111)
CO2: 24 mmol/L (ref 22–32)
CREATININE: 1.11 mg/dL — AB (ref 0.44–1.00)
Calcium: 7 mg/dL — ABNORMAL LOW (ref 8.9–10.3)
GFR, EST AFRICAN AMERICAN: 47 mL/min — AB (ref >60)
GFR, EST NON AFRICAN AMERICAN: 41 mL/min — AB (ref >60)
Glucose, Bld: 124 mg/dL — ABNORMAL HIGH (ref 65–99)
POTASSIUM: 4.3 mmol/L (ref 3.5–5.1)
SODIUM: 142 mmol/L (ref 135–145)
Total Bilirubin: 0.5 mg/dL (ref 0.3–1.2)
Total Protein: 4.2 g/dL — ABNORMAL LOW (ref 6.5–8.1)

## 2016-09-01 LAB — FOLATE: FOLATE: 37.2 ng/mL (ref >5.9)

## 2016-09-01 LAB — LACTIC ACID, PLASMA: LACTIC ACID, VENOUS: 1.1 mmol/L (ref 0.5–1.9)

## 2016-09-01 LAB — FERRITIN: Ferritin: 69 ng/mL (ref 11–307)

## 2016-09-01 LAB — VITAMIN B12: VITAMIN B 12: 2542 pg/mL — AB (ref 180–914)

## 2016-09-01 MED ORDER — ADULT MULTIVITAMIN W/MINERALS CH
1.0000 | ORAL_TABLET | Freq: Every day | ORAL | Status: DC
  Administered 2016-09-01 – 2016-09-05 (×5): 1 via ORAL
  Filled 2016-09-01 (×5): qty 1

## 2016-09-01 MED ORDER — SODIUM CHLORIDE 0.9 % IV BOLUS (SEPSIS)
500.0000 mL | Freq: Once | INTRAVENOUS | Status: AC
  Administered 2016-09-01: 500 mL via INTRAVENOUS

## 2016-09-01 MED ORDER — FLEET ENEMA 7-19 GM/118ML RE ENEM: 1.0000 | ENEMA | Freq: Once | RECTAL | Status: DC

## 2016-09-01 MED ORDER — PANTOPRAZOLE SODIUM 40 MG PO TBEC
40.0000 mg | DELAYED_RELEASE_TABLET | Freq: Every day | ORAL | Status: DC
  Administered 2016-09-01 – 2016-09-05 (×5): 40 mg via ORAL
  Filled 2016-09-01 (×5): qty 1

## 2016-09-01 MED ORDER — METOPROLOL TARTRATE 12.5 MG HALF TABLET
12.5000 mg | ORAL_TABLET | Freq: Two times a day (BID) | ORAL | Status: DC
  Administered 2016-09-01 – 2016-09-05 (×9): 12.5 mg via ORAL
  Filled 2016-09-01 (×9): qty 1

## 2016-09-01 MED ORDER — ENSURE ENLIVE PO LIQD
237.0000 mL | Freq: Three times a day (TID) | ORAL | Status: DC
  Administered 2016-09-01 – 2016-09-05 (×8): 237 mL via ORAL
  Filled 2016-09-01: qty 237

## 2016-09-01 MED ORDER — VITAMIN D 1000 UNITS PO TABS
1000.0000 [IU] | ORAL_TABLET | Freq: Two times a day (BID) | ORAL | Status: DC
  Administered 2016-09-01 – 2016-09-05 (×9): 1000 [IU] via ORAL
  Filled 2016-09-01 (×9): qty 1

## 2016-09-01 MED ORDER — POLYETHYLENE GLYCOL 3350 17 G PO PACK
17.0000 g | PACK | Freq: Four times a day (QID) | ORAL | Status: AC
  Administered 2016-09-01 – 2016-09-02 (×4): 17 g via ORAL
  Filled 2016-09-01 (×4): qty 1

## 2016-09-01 MED ORDER — VITAMIN D3 25 MCG (1000 UT) PO CAPS: 1000.0000 [IU] | ORAL_CAPSULE | Freq: Two times a day (BID) | ORAL | Status: DC

## 2016-09-01 MED ORDER — FLEET ENEMA 7-19 GM/118ML RE ENEM
1.0000 | ENEMA | Freq: Once | RECTAL | Status: AC
  Administered 2016-09-01: 1 via RECTAL
  Filled 2016-09-01: qty 1

## 2016-09-01 MED ORDER — FERROUS SULFATE 325 (65 FE) MG PO TABS
325.0000 mg | ORAL_TABLET | Freq: Every day | ORAL | Status: DC
  Administered 2016-09-02 – 2016-09-03 (×2): 325 mg via ORAL
  Filled 2016-09-01 (×2): qty 1

## 2016-09-01 MED ORDER — VITAMIN C 500 MG PO TABS
500.0000 mg | ORAL_TABLET | Freq: Every day | ORAL | Status: DC
  Administered 2016-09-01 – 2016-09-05 (×5): 500 mg via ORAL
  Filled 2016-09-01 (×5): qty 1

## 2016-09-01 NOTE — Evaluation (Signed)
Clinical/Bedside Swallow Evaluation Patient Details  Name: Kelly Porter MRN: [id number] Date of Birth: 08/09/1919  Today's Date: 09/01/2016 Time: SLP Start Time (ACUTE ONLY): 1125 SLP Stop Time (ACUTE ONLY): 1147 SLP Time Calculation (min) (ACUTE ONLY): 22 min  Past Medical History:  Past Medical History:  Diagnosis Date  . Anemia "always"  . Anxiety   . Arthritis    "back" (05/19/2015)  . Bowel obstruction   . Chronic lower back pain    "qd for the last month" (05/19/2015)  . History of blood transfusion 05/19/2015   "low HgB"  . Melanoma of face ([place])    "skin only; no other treatment"  . Pneumonia X 1   Past Surgical History:  Past Surgical History:  Procedure Laterality Date  . ABDOMINAL EXPLORATION SURGERY     "thought it was her ovaries; found diverticulitis"  . MELANOMA EXCISION     face   HPI:  Patient is a 82 y.o.femalewith past medical history of chronic iron deficiency anemia due to GI loss, chronic anxiety who presented to the hospital with confusion. She was thought to have sepsis from unknown foci along with encephalopathy and admitted for further evaluation and treatment. Likely secondary to enteritis. Sepsis pathophysiology seems to have resolved. Await cultures-we will de-escalate antibiotics accordingly.    Assessment / Plan / Recommendation Clinical Impression  Pt didn't display any oropharyngeal deficits when consuming solids and thin liquids via straw. She appears at reduced risk for aspiration with PO intake when following general aspiration precautions. Education provied to nursing and will defere to MD for further PO upgrade. Although pt consumed solids without any overt s/s of oropharyngeal dysphagia, she states taht she only consumed soft solids at home. Nursing aware. ST to sign off.     Aspiration Risk  Mild aspiration risk    Diet Recommendation Dysphagia 3 (Mech soft);Thin liquid (As MD allows)   Liquid Administration via: Straw Medication  Administration: Whole meds with liquid Supervision: Patient able to self feed Postural Changes: Seated upright at 90 degrees    Other  Recommendations Recommended Consults: Consider GI evaluation Oral Care Recommendations: Oral care BID   Follow up Recommendations None             Prognosis Prognosis for Safe Diet Advancement: Good      Swallow Study   General Date of Onset: 08/31/16 HPI: Patient is a 82 y.o.femalewith past medical history of chronic iron deficiency anemia due to GI loss, chronic anxiety who presented to the hospital with confusion. She was thought to have sepsis from unknown foci along with encephalopathy and admitted for further evaluation and treatment. Likely secondary to enteritis. Sepsis pathophysiology seems to have resolved. Await cultures-we will de-escalate antibiotics accordingly.  Type of Study: Bedside Swallow Evaluation Previous Swallow Assessment:  (N/A) Diet Prior to this Study: Thin liquids Temperature Spikes Noted: No Respiratory Status: Room air History of Recent Intubation: No Behavior/Cognition: Alert;Cooperative;Pleasant mood Oral Cavity Assessment: Within Functional Limits Oral Care Completed by SLP: No Oral Cavity - Dentition: Adequate natural dentition Vision: Functional for self-feeding Self-Feeding Abilities: Able to feed self Patient Positioning: Upright in bed Baseline Vocal Quality: Normal Volitional Cough: Strong Volitional Swallow: Able to elicit    Oral/Motor/Sensory Function Overall Oral Motor/Sensory Function: Within functional limits   Ice Chips Ice chips: Within functional limits Presentation: Self Fed;Spoon   Thin Liquid Thin Liquid: Within functional limits Presentation: Straw    Nectar Thick Nectar Thick Liquid: Not tested   Honey Thick Honey Thick  Liquid: Not tested   Puree Puree: Within functional limits Presentation: Spoon;Self Fed   Solid   GO   Solid: Within functional limits Presentation: Self Fed        Danyon Mcginness B. Rutherford Nail, M.S., [place] Pathologist [phone] Damonique Brunelle 09/01/2016,12:10 PM

## 2016-09-01 NOTE — Care Management Note (Addendum)
Case Management Note  Patient Details  Name: Kelly Porter MRN: [id number] Date of Birth: 01/12/1919  Subjective/Objective:   Presents with sepsis , encephalopathy,   Patient lives alone in [place], she does not have 24 hr care, has [place], [place] [phone].  POA takes her to [facility], patient has insurance and she has AARP, no problem getting medications.  PCP is Josetta Huddle.  Patient has been to [facility] before in [place] thinks she needs to get some rehab before she goes back home.   Will need pt/ot eval. NCM will cont to follow and make referral to CSW.                  Action/Plan:   Expected Discharge Date:                  Expected Discharge Plan:  Skilled Nursing Facility  In-House Referral:  Clinical Social Work  Discharge planning Services  CM Consult  Post Acute Care Choice:    Choice offered to:     DME Arranged:    DME Agency:     HH Arranged:    [place] Agency:     Status of Service:  In process, will continue to follow  If discussed at Long Length of Stay Meetings, dates discussed:    Additional Comments:  Zenon Mayo, RN 09/01/2016, 3:44 PM

## 2016-09-01 NOTE — Progress Notes (Signed)
Patient had 3 brown medium sized watery, loose stools following enema administration. Will continue to monitor.

## 2016-09-01 NOTE — Progress Notes (Signed)
PROGRESS NOTE        PATIENT DETAILS Name: Kelly Porter Age: 82 y.o. Sex: female Date of Birth: Dec 16, 1918 Admit Date: 08/31/2016 Admitting Physician Waldemar Dickens, MD [id number] NEVILL, MD  Brief Narrative: Patient is a 82 y.o. female with past medical history of chronic iron deficiency anemia due to GI loss, chronic anxiety who presented to the hospital with confusion. She was thought to have sepsis from unknown foci along with encephalopathy and admitted for further evaluation and treatment.   Subjective: Awake and alert this morning, answers almost all my questions appropriately. Main complaint is ongoing abdominal pain.  Had multiple bowel movements following enema yesterday.  Assessment/Plan: Active Problems: Sepsis: Likely secondary to enteritis. Sepsis pathophysiology seems to have resolved. Await cultures-we will de-escalate antibiotics accordingly.  Acute metabolic encephalopathy: Likely secondary to above, encephalopathy has significantly improved. CT head/MRI brain negative for acute abnormalities including CVA.  Probable enteritis: No vomiting, had a numerous bowel movements following enema. Continue supportive measures, start clear liquids. Repeat x-ray abdomen this morning does not show any bowel obstruction or free air. Abdomen continues to be diffusely tender but it is soft on exam.   Acute kidney injury: Likely hemodynamically mediated, resolving with IV fluids and other supportive measures.  Right colonic thickening: GI following-given history of chronic iron deficiency anemia-obviously a cause of concern. However patient is 82 years old, reviewed prior discharge summaries (August 2016)-in the past patient has not pursued GI workup.  Anemia: Worsening hemoglobin likely secondary to acute illness and IV fluid dilution. Has history of chronic iron deficiency anemia from  chronic GI loss. Check iron panel-if needed we can start IV iron  while in the hospital. Note no overt evidence of blood loss at this time.  Hypertension: Resume low-dose metoprolol-follow and adjust accordingly  Chronic back pain: Resume as needed tramadol.  DVT Prophylaxis: Prophylactic Heparin  Code Status: DNR  Family Communication: None at [place] for niece  Disposition Plan: Remain inpatient-remain in SDU  Antimicrobial agents: See below  Procedures: None  CONSULTS:  GI  Time spent: 25 minutes-Greater than 50% of this time was spent in counseling, explanation of diagnosis, planning of further management, and coordination of care.  MEDICATIONS: Anti-infectives    Start     Dose/Rate Route Frequency Ordered Stop   09/01/16 1400  vancomycin (VANCOCIN) 500 mg in sodium chloride 0.9 % 100 mL IVPB     500 mg 100 mL/hr over 60 Minutes Intravenous Every 24 hours 08/31/16 1258     08/31/16 2120  piperacillin-tazobactam (ZOSYN) IVPB 2.25 g     2.25 g 100 mL/hr over 30 Minutes Intravenous Every 8 hours 08/31/16 2001     08/31/16 2100  piperacillin-tazobactam (ZOSYN) IVPB 2.25 g  Status:  Discontinued     2.25 g 100 mL/hr over 30 Minutes Intravenous Every 6 hours 08/31/16 1258 08/31/16 2001   08/31/16 1245  vancomycin (VANCOCIN) IVPB 750 mg/150 ml premix     750 mg 150 mL/hr over 60 Minutes Intravenous  Once 08/31/16 1241 08/31/16 1450   08/31/16 1230  piperacillin-tazobactam (ZOSYN) IVPB 3.375 g     3.375 g 100 mL/hr over 30 Minutes Intravenous  Once 08/31/16 1223 08/31/16 1357   08/31/16 1230  vancomycin (VANCOCIN) IVPB 1000 mg/200 mL premix  Status:  Discontinued     1,000 mg 200 mL/hr over 60  Minutes Intravenous  Once 08/31/16 1223 08/31/16 1837      Scheduled Meds: . heparin  5,000 Units Subcutaneous Q8H  . piperacillin-tazobactam (ZOSYN)  IV  2.25 g Intravenous Q8H  . sodium chloride flush  3 mL Intravenous Q12H  . vancomycin  500 mg Intravenous Q24H   Continuous Infusions: . sodium chloride 100 mL/hr  (08/31/16 2243)  . dextrose 5 % and 0.45% NaCl Stopped (08/31/16 2223)   PRN Meds:.acetaminophen **OR** acetaminophen, fentaNYL (SUBLIMAZE) injection, metoprolol, ondansetron **OR** ondansetron (ZOFRAN) IV, traMADol   PHYSICAL EXAM: Vital signs: Vitals:   08/31/16 1800 08/31/16 1947 08/31/16 2309 09/01/16 0409  BP:  (!) 101/44 (!) 108/44 (!) 102/53  Pulse: 88 87 80 97  Resp: 17 (!) 22 20 (!) 27  Temp:  98.1 F (36.7 C) 98.3 F (36.8 C) 97.6 F (36.4 C)  TempSrc:  Oral Oral Oral  SpO2: 98% 100% 100% 97%  Weight:      Height:       Filed Weights   08/31/16 1235  Weight: 39 kg (86 lb)   Body mass index is 15.73 kg/m.   General appearance :Awake, alert,Appears frail and chronically ill-appearing.  Eyes:, pupils equally reactive to light and accomodation,no scleral icterus.Pink conjunctiva HEENT: Atraumatic and Normocephalic Neck: supple, no JVD. No cervical lymphadenopathy. No thyromegaly Resp:Good air entry bilaterally, no added sounds  CVS: S1 S2 regular, GI: Bowel sounds hypoactive, soft but diffusely tender with mild guarding. Extremities: B/L Lower Ext shows no edema, both legs are warm to touch Neurology:  speech clear,Non focal, sensation is grossly intact. Psychiatric: Alert and oriented x 3. Normal mood. Musculoskeletal:No digital cyanosis Skin:No Rash, warm and dry Wounds:N/A  I have personally reviewed following labs and imaging studies  LABORATORY DATA: CBC:  Recent Labs Lab 08/31/16 1148 08/31/16 1159 09/01/16 0509  WBC 5.8  --  7.3  NEUTROABS 5.1  --   --   HGB 9.7* 10.9* 7.1*  HCT 31.3* 32.0* 23.5*  MCV 87.7  --  88.3  PLT 305  --  99991111    Basic Metabolic Panel:  Recent Labs Lab 08/31/16 1148 08/31/16 1159 08/31/16 1723 09/01/16 0509  NA 135 136  --  142  K 4.6 4.4 4.9 4.3  CL 100* 101  --  110  CO2 21*  --   --  24  GLUCOSE 163* 162*  --  124*  BUN 31* 30*  --  33*  CREATININE 1.25* 1.00  --  1.11*  CALCIUM 8.5*  --   --  7.0*     GFR: Estimated Creatinine Clearance: 18.7 mL/min (by C-G formula based on SCr of 1.11 mg/dL (H)).  Liver Function Tests:  Recent Labs Lab 08/31/16 1148 09/01/16 0509  AST 38 19  ALT 11* 10*  ALKPHOS 69 49  BILITOT 0.7 0.5  PROT 5.4* 4.2*  ALBUMIN 3.2* 2.3*   No results for input(s): LIPASE, AMYLASE in the last 168 hours. No results for input(s): AMMONIA in the last 168 hours.  Coagulation Profile:  Recent Labs Lab 08/31/16 1148  INR 1.09    Cardiac Enzymes: No results for input(s): CKTOTAL, CKMB, CKMBINDEX, TROPONINI in the last 168 hours.  BNP (last 3 results) No results for input(s): PROBNP in the last 8760 hours.  HbA1C: No results for input(s): HGBA1C in the last 72 hours.  CBG: No results for input(s): GLUCAP in the last 168 hours.  Lipid Profile: No results for input(s): CHOL, HDL, LDLCALC, TRIG, CHOLHDL, LDLDIRECT in the last  72 hours.  Thyroid Function Tests: No results for input(s): TSH, T4TOTAL, FREET4, T3FREE, THYROIDAB in the last 72 hours.  Anemia Panel: No results for input(s): VITAMINB12, FOLATE, FERRITIN, TIBC, IRON, RETICCTPCT in the last 72 hours.  Urine analysis:    Component Value Date/Time   COLORURINE AMBER (A) 08/31/2016 1549   APPEARANCEUR CLEAR 08/31/2016 1549   LABSPEC 1.042 (H) 08/31/2016 1549   PHURINE 5.5 08/31/2016 1549   GLUCOSEU NEGATIVE 08/31/2016 1549   HGBUR NEGATIVE 08/31/2016 1549   BILIRUBINUR NEGATIVE 08/31/2016 1549   KETONESUR NEGATIVE 08/31/2016 1549   PROTEINUR NEGATIVE 08/31/2016 1549   UROBILINOGEN 0.2 05/19/2015 1856   NITRITE NEGATIVE 08/31/2016 1549   LEUKOCYTESUR NEGATIVE 08/31/2016 1549    Sepsis Labs: Lactic Acid, Venous    Component Value Date/Time   LATICACIDVEN 1.1 09/01/2016 0509    MICROBIOLOGY: Recent Results (from the past 240 hour(s))  MRSA PCR Screening     Status: Abnormal   Collection Time: 08/31/16  5:09 PM  Result Value Ref Range Status   MRSA by PCR POSITIVE (A) NEGATIVE  Final    Comment:        The GeneXpert MRSA Assay (FDA approved for NASAL specimens only), is one component of a comprehensive MRSA colonization surveillance program. It is not intended to diagnose MRSA infection nor to guide or monitor treatment for MRSA infections. RESULT CALLED TO, READ BACK BY AND VERIFIED WITH: M.YORK,RN AT 2149 BY L.PITT 08/31/16     RADIOLOGY STUDIES/RESULTS: Dg Chest 1 View  Result Date: 08/31/2016 CLINICAL DATA:  Chest pain EXAM: CHEST 1 VIEW COMPARISON:  07/13/2012 FINDINGS: Heart is normal size. Mediastinal contours are within normal limits. Calcifications within the aortic arch. Mild interstitial prominence throughout the lungs, new since prior study. No effusions or acute bony abnormality. IMPRESSION: New mild interstitial prominence throughout the lungs which could reflect chronic interstitial lung disease. Electronically Signed   By: Rolm Baptise M.D.   On: 08/31/2016 13:03   Ct Head Wo Contrast  Result Date: 08/31/2016 CLINICAL DATA:  Altered mental status EXAM: CT HEAD WITHOUT CONTRAST TECHNIQUE: Contiguous axial images were obtained from the base of the skull through the vertex without intravenous contrast. COMPARISON:  05/19/2015 FINDINGS: Brain: There is atrophy and chronic small vessel disease changes. No acute intracranial abnormality. Specifically, no hemorrhage, hydrocephalus, mass lesion, acute infarction, or significant intracranial injury. Vascular: No hyperdense vessel or unexpected calcification. Skull: No acute calvarial abnormality. Sinuses/Orbits: Visualized paranasal sinuses and mastoids clear. Orbital soft tissues unremarkable. Other: None IMPRESSION: No acute intracranial abnormality. Atrophy, chronic microvascular disease. Electronically Signed   By: Rolm Baptise M.D.   On: 08/31/2016 13:06   Mr Brain Wo Contrast  Result Date: 09/01/2016 CLINICAL DATA:  Slurred speech. EXAM: MRI HEAD WITHOUT CONTRAST TECHNIQUE: Multiplanar, multiecho  pulse sequences of the brain and surrounding structures were obtained without intravenous contrast. COMPARISON:  CT head 08/31/2016 FINDINGS: Brain: Advanced cerebral atrophy. Ventricular enlargement consistent with atrophy. Negative for acute infarct. Mild chronic microvascular ischemic change. Negative for hemorrhage Right frontal parafalcine extra-axial mass lesion compatible with meningioma. This measures 8 x 15 mm. No edema in the adjacent brain. Normal pituitary. Vascular: Normal arterial flow void. Skull and upper cervical spine: Negative Sinuses/Orbits: Negative Other: None IMPRESSION: No acute infarct.  Mild chronic microvascular ischemic change Advanced atrophy Right frontal parafalcine meningioma measuring 8 x 15 mm. Electronically Signed   By: Franchot Gallo M.D.   On: 09/01/2016 10:02   Ct Abdomen Pelvis W Contrast  Result Date:  08/31/2016 CLINICAL DATA:  82 year old with nausea, vomiting and pain. EXAM: CT ABDOMEN AND PELVIS WITH CONTRAST TECHNIQUE: Multidetector CT imaging of the abdomen and pelvis was performed using the standard protocol following bolus administration of intravenous contrast. CONTRAST:  48mL ISOVUE-300 IOPAMIDOL (ISOVUE-300) INJECTION 61% COMPARISON:  06/13/2016 FINDINGS: Lower chest: Mild dependent atelectasis at the lung bases. No large pleural effusions. Small-moderate sized hiatal hernia. Right pleural effusion has resolved from the previous examination. Hepatobiliary: Small amount of perihepatic ascites. Otherwise, normal appearance of the liver, gallbladder and portal venous system is patent. No significant biliary dilatation. Pancreas: Normal appearance of the pancreas without inflammation or duct dilatation. Spleen: Normal appearance of spleen without enlargement. Adrenals/Urinary Tract: Normal adrenal glands. Again noted is an extrarenal pelvis in both kidneys. Tiny cyst in the right kidney lower pole. There is no significant hydronephrosis. Limited evaluation of the  urinary bladder due to artifact from the left hip arthroplasty. There is fluid in the urinary bladder. Probable small cyst in left kidney. Stomach/Bowel: Small to moderate sized hiatal hernia. Multiple diverticula involving the sigmoid colon. Dilated and fluid-filled loops of small bowel with wall enhancement. There are fluid-filled and mildly dilated loops of colon. The appendix is a mildly distended and fluid-filled, best seen on sequence 2, image 61. Appendix roughly measures 0.8 cm in diameter. No definite inflammatory changes around the appendix. Limited evaluation of the ileocecal valve. There is concern for mild focal narrowing of the right colon on sequence 2, image 50. Vascular/Lymphatic: Atherosclerotic calcifications in aorta without aneurysm. No significant lymph node enlargement in the abdomen or pelvis. Reproductive: Uterus is not confidently identified but this area is poorly characterized due to artifact from the left hip replacement. Other: Small amount of free fluid in the pelvis and right lower quadrant. Small amount of fluid around the liver. Musculoskeletal: Left hip arthroplasty is located. Disc space narrowing along the anterior aspect of L1-L2. IMPRESSION: Again noted are dilating fluid-filled loops of distal small bowel and colon. A transition point is not clearly identified and findings may be associated with an ileus. Again noted is a small amount of abdominal and pelvic ascites. There is wall enhancement of the distal small bowel and enteritis cannot be excluded. Focal narrowing and wall thickening in the right colon. This colonic narrowing is more prominent than the previous examination and this could represent focal peristalsis. Colonic lesion in this area cannot be excluded. Hiatal hernia. Electronically Signed   By: Markus Daft M.D.   On: 08/31/2016 13:25   Dg Abd 2 Views  Result Date: 09/01/2016 CLINICAL DATA:  Abdominal pain and constipation EXAM: ABDOMEN - 2 VIEW COMPARISON:   CT abdomen and pelvis August 31, 2016 FINDINGS: Supine and left lateral decubitus images obtained. There is mild stool in the colon. There is no appreciable bowel dilatation or air-fluid level to suggest bowel obstruction. No free air. There is a total hip prosthesis on the left. There is evidence of old healed fracture of the right posterior eleventh rib. IMPRESSION: Bowel gas pattern unremarkable without evident bowel obstruction or free air. Electronically Signed   By: Lowella Grip III M.D.   On: 09/01/2016 08:38     LOS: 1 day   Oren Binet, MD  [facility] Pager:[phone]  If 7PM-7AM, please contact night-coverage [email or web address] Password [email or web address] 09/01/2016, 10:56 AM

## 2016-09-01 NOTE — Progress Notes (Signed)
Initial Nutrition Assessment  DOCUMENTATION CODES:   Severe malnutrition in context of chronic illness, Underweight  INTERVENTION:    Vanilla Ensure Enlive po TID, each supplement provides 350 kcal and 20 grams of protein  NUTRITION DIAGNOSIS:   Malnutrition related to chronic illness as evidenced by severe depletion of muscle mass, severe depletion of body fat, percent weight loss (9% weight loss within 3 months).  GOAL:   Patient will meet greater than or equal to 90% of their needs  MONITOR:   PO intake, Supplement acceptance, Weight trends, Skin, I & O's  REASON FOR ASSESSMENT:   Malnutrition Screening Tool    ASSESSMENT:   82 y.o. female with past medical history of chronic iron deficiency anemia due to GI loss, chronic anxiety who presented to the hospital with confusion. She was thought to have sepsis from unknown foci along with encephalopathy and admitted for further evaluation and treatment.   Patient reports poor intake related to ongoing poor appetite. She likes to drink Ensure supplements, prefers vanilla flavor. From review of usual weights, patient has lost 9% of usual weight over the past 3 months. Nutrition-Focused physical exam completed. Findings are severe fat depletion, severe muscle depletion, and no edema.  Patient with severe PCM. Labs reviewed. Medications reviewed and include Vitamins C & D, MVI.   Diet Order:  DIET SOFT Room service appropriate? Yes; Fluid consistency: Thin  Skin:  Reviewed, no issues  Last BM:  11/15  Height:   Ht Readings from Last 1 Encounters:  08/31/16 5\' 2"  (1.575 m)    Weight:   Wt Readings from Last 1 Encounters:  08/31/16 86 lb (39 kg)    Ideal Body Weight:  50 kg  BMI:  Body mass index is 15.73 kg/m.  Estimated Nutritional Needs:   Kcal:  1200-1400  Protein:  60-70 gm  Fluid:  1.5 L  EDUCATION NEEDS:   No education needs identified at this time  Molli Barrows, [place], [place], [place] Pager  [phone] After Hours Pager [phone]

## 2016-09-01 NOTE — Progress Notes (Signed)
Patient currently is asleep and did not awaken.  Patient appears to be making progress, from Fleet enemas, in terms of getting cleaned out.  She is currently on a soft diet, which she is apparently tolerating satisfactorily. Therefore, I will add MiraLAX to her cleanout regimen.  My hope is to be able to do a single column barium enema on Friday morning, to check for gross abnormalities in the colon that might account for her anemia. Note that she was Hemoccult positive as recently has 10 weeks ago.  Cleotis Nipper, M.D. Pager [phone] If no answer or after 5 PM call [phone]

## 2016-09-02 ENCOUNTER — Encounter (HOSPITAL_COMMUNITY): Payer: Self-pay | Admitting: *Deleted

## 2016-09-02 DIAGNOSIS — D649 Anemia, unspecified: Secondary | ICD-10-CM

## 2016-09-02 LAB — CBC
HEMATOCRIT: 24.5 % — AB (ref 36.0–46.0)
Hemoglobin: 7.4 g/dL — ABNORMAL LOW (ref 12.0–15.0)
MCH: 26.8 pg (ref 26.0–34.0)
MCHC: 30.2 g/dL (ref 30.0–36.0)
MCV: 88.8 fL (ref 78.0–100.0)
PLATELETS: 275 10*3/uL (ref 150–400)
RBC: 2.76 MIL/uL — ABNORMAL LOW (ref 3.87–5.11)
RDW: 14.6 % (ref 11.5–15.5)
WBC: 6.3 10*3/uL (ref 4.0–10.5)

## 2016-09-02 LAB — PROCALCITONIN: PROCALCITONIN: 11.55 ng/mL

## 2016-09-02 LAB — BASIC METABOLIC PANEL
Anion gap: 5 (ref 5–15)
BUN: 26 mg/dL — AB (ref 6–20)
CALCIUM: 7.5 mg/dL — AB (ref 8.9–10.3)
CO2: 24 mmol/L (ref 22–32)
CREATININE: 0.91 mg/dL (ref 0.44–1.00)
Chloride: 111 mmol/L (ref 101–111)
GFR calc Af Amer: >60 mL/min (ref >60)
GFR, EST NON AFRICAN AMERICAN: 52 mL/min — AB (ref >60)
GLUCOSE: 110 mg/dL — AB (ref 65–99)
Potassium: 3.9 mmol/L (ref 3.5–5.1)
Sodium: 140 mmol/L (ref 135–145)

## 2016-09-02 MED ORDER — MUPIROCIN 2 % EX OINT
1.0000 "application " | TOPICAL_OINTMENT | Freq: Two times a day (BID) | CUTANEOUS | Status: DC
  Administered 2016-09-02 – 2016-09-05 (×7): 1 via NASAL
  Filled 2016-09-02 (×3): qty 22

## 2016-09-02 MED ORDER — SODIUM CHLORIDE 0.9 % IV SOLN
1.5000 g | Freq: Two times a day (BID) | INTRAVENOUS | Status: DC
  Administered 2016-09-02 – 2016-09-04 (×4): 1.5 g via INTRAVENOUS
  Filled 2016-09-02 (×6): qty 1.5

## 2016-09-02 MED ORDER — SODIUM CHLORIDE 0.9 % IV SOLN
510.0000 mg | Freq: Once | INTRAVENOUS | Status: AC
  Administered 2016-09-02: 510 mg via INTRAVENOUS
  Filled 2016-09-02 (×2): qty 17

## 2016-09-02 MED ORDER — CHLORHEXIDINE GLUCONATE CLOTH 2 % EX PADS
6.0000 | MEDICATED_PAD | Freq: Every day | CUTANEOUS | Status: DC
  Administered 2016-09-02 – 2016-09-05 (×4): 6 via TOPICAL

## 2016-09-02 NOTE — Progress Notes (Signed)
Patient is awake and alert, and in no evident distress. She states that she is hungry and anxious for breakfast.  Hemoglobin is stable, actually slightly improved from yesterday, at 7.4.  I will continue intestinal cleanout with frequent MiraLAX doses, as well as another Fleet enema this afternoon.  I have ordered a single column barium enema for tomorrow morning, having explained the procedure to the patient. Hopefully she will tolerate it okay and that will clarify whether or not there is a mass in the proximal colon.  Cleotis Nipper, M.D. Pager [phone] If no answer or after 5 PM call [phone]

## 2016-09-02 NOTE — Progress Notes (Signed)
Pharmacy Antibiotic Note  Kelly Porter is a 82 y.o. female admitted on 08/31/2016 with sepsis. MD discontinued IV vancomycin and Zosyn today. Descalate to Unasyn ,per pharmacy protocol for Intra-abdominal Infection.    Tc 98.6 Porter, Tmax is 98.6 and WBC is WNL.  AKI: resolved with IV fluids. SCr 0.91, CrCl ~ 23 ml/min,  Kelly Porter , wt =39 kg (<IBW)  WNL and lactic acid is down from 4.08 >3.14>2.6>1.1.   Plan: Unasyn 1.5gm IV q12h -- Porter/u renal fxn, C&S, clinical status   Height: 5\' 2"  (157.5 cm) Weight: 86 lb (39 kg) IBW/kg (Calculated) : 50.1  Temp (24hrs), Avg:98.1 Porter (36.7 C), Min:97.4 Porter (36.3 C), Max:98.6 Porter (37 C)   Recent Labs Lab 08/31/16 1148 08/31/16 1159 08/31/16 1200 08/31/16 1501 08/31/16 1723 08/31/16 2115 09/01/16 0509 09/02/16 0415 09/02/16 0621  WBC 5.8  --   --   --   --   --  7.3  --  6.3  CREATININE 1.25* 1.00  --   --   --   --  1.11* 0.91  --   LATICACIDVEN  --   --  4.08* 3.14* 3.1* 2.6* 1.1  --   --     Estimated Creatinine Clearance: 22.8 mL/min (by C-G formula based on SCr of 0.91 mg/dL).    No Known Allergies  Antimicrobials this admission: Vanc 11/14>>11/16 Zosyn 11/14>>11/16 Unasyn 11/16>  Dose adjustments this admission: N/A  Microbiology results: 11/14 BCx x2: ngtd 11/14 MRSA PCR : positive  Thank you for allowing pharmacy to be a part of this patient's care. Nicole Cella, RPh Clinical Pharmacist Pager: [phone] 09/02/2016 12:24 PM

## 2016-09-02 NOTE — Progress Notes (Signed)
PROGRESS NOTE        PATIENT DETAILS Name: PAISLEY SHEFFLER Age: 82 y.o. Sex: female Date of Birth: 06/26/1919 Admit Date: 08/31/2016 Admitting Physician Waldemar Dickens, MD [id number] NEVILL, MD  Brief Narrative: Patient is a 82 y.o. female with past medical history of chronic iron deficiency anemia due to GI loss, chronic anxiety who presented to the hospital with confusion. She was thought to have sepsis from unknown foci along with encephalopathy and admitted for further evaluation and treatment.   Subjective: Awake and alert this morning, abdominal pain has improved. She had multiple bowel movements following enemas yesterday.   Assessment/Plan: Active Problems: Sepsis: Likely secondary to enteritis. Sepsis pathophysiology seems to have resolved. Blood cultures are negative so far, we will discontinue both vancomycin and Zosyn today, we will de-escalate to Unasyn today.   Acute metabolic encephalopathy: Likely secondary to above, encephalopathy has resolved. CT head/MRI brain negative for acute abnormalities including CVA.  Probable enteritis: No vomiting, had a numerous bowel movements following enema. Continue supportive measures, have slowly advance diet to a soft diet which she seems to be tolerating.  Abdomen continues to be soft with some amount of generalized tenderness-but it seems to be much improved compared to yesterday.  Acute kidney injury: Likely hemodynamically mediated, resolved with IV fluids  Right colonic thickening: GI following-given history of chronic iron deficiency anemia-obviously a cause of concern. However patient is 82 years old, reviewed prior discharge summaries (August 2016)-in the past patient has not pursued GI workup. GI following, plans are for a single contrast barium enema on 11/17 to rule out a mass in the right colon.  Anemia: Worsening hemoglobin likely secondary to acute illness and IV fluid dilution. Has history  of chronic iron deficiency anemia from  chronic GI loss. Iron panel, shows anemia most consistent with iron deficiency-it seems that her ferritin has slowly improved over the past several months-she is on iron supplementation at home. We will go ahead and have pharmacy dose IV iron while inpatient..  Note no overt evidence of blood loss at this time.  Hypertension: Resume low-dose metoprolol-follow and adjust accordingly  Chronic back pain: Resume as needed tramadol.  DVT Prophylaxis: Prophylactic Heparin  Code Status: DNR  Family Communication: None at bedside  Disposition Plan: Remain inpatient-transfer to med surg  Antimicrobial agents: See below  Procedures: None  CONSULTS:  GI  Time spent: 25 minutes-Greater than 50% of this time was spent in counseling, explanation of diagnosis, planning of further management, and coordination of care.  MEDICATIONS: Anti-infectives    Start     Dose/Rate Route Frequency Ordered Stop   09/01/16 1400  vancomycin (VANCOCIN) 500 mg in sodium chloride 0.9 % 100 mL IVPB     500 mg 100 mL/hr over 60 Minutes Intravenous Every 24 hours 08/31/16 1258     08/31/16 2120  piperacillin-tazobactam (ZOSYN) IVPB 2.25 g     2.25 g 100 mL/hr over 30 Minutes Intravenous Every 8 hours 08/31/16 2001     08/31/16 2100  piperacillin-tazobactam (ZOSYN) IVPB 2.25 g  Status:  Discontinued     2.25 g 100 mL/hr over 30 Minutes Intravenous Every 6 hours 08/31/16 1258 08/31/16 2001   08/31/16 1245  vancomycin (VANCOCIN) IVPB 750 mg/150 ml premix     750 mg 150 mL/hr over 60 Minutes Intravenous  Once 08/31/16 1241 08/31/16 1450  08/31/16 1230  piperacillin-tazobactam (ZOSYN) IVPB 3.375 g     3.375 g 100 mL/hr over 30 Minutes Intravenous  Once 08/31/16 1223 08/31/16 1357   08/31/16 1230  vancomycin (VANCOCIN) IVPB 1000 mg/200 mL premix  Status:  Discontinued     1,000 mg 200 mL/hr over 60 Minutes Intravenous  Once 08/31/16 1223 08/31/16 1837       Scheduled Meds: . Chlorhexidine Gluconate Cloth  6 each Topical Q0600  . cholecalciferol  1,000 Units Oral BID  . feeding supplement (ENSURE ENLIVE)  237 mL Oral TID BM  . ferrous sulfate  325 mg Oral Q breakfast  . heparin  5,000 Units Subcutaneous Q8H  . metoprolol tartrate  12.5 mg Oral BID  . multivitamin with minerals  1 tablet Oral Daily  . mupirocin ointment  1 application Nasal BID  . pantoprazole  40 mg Oral Daily  . piperacillin-tazobactam (ZOSYN)  IV  2.25 g Intravenous Q8H  . polyethylene glycol  17 g Oral QID  . sodium chloride flush  3 mL Intravenous Q12H  . sodium phosphate  1 enema Rectal Once  . vancomycin  500 mg Intravenous Q24H  . vitamin C  500 mg Oral Daily   Continuous Infusions: . dextrose 5 % and 0.45% NaCl 20 mL/hr at 09/01/16 2300   PRN Meds:.acetaminophen **OR** acetaminophen, fentaNYL (SUBLIMAZE) injection, ondansetron **OR** ondansetron (ZOFRAN) IV, traMADol   PHYSICAL EXAM: Vital signs: Vitals:   09/01/16 2000 09/02/16 0000 09/02/16 0444 09/02/16 0800  BP: (!) 113/49 (!) 99/44 (!) 107/55 127/62  Pulse: 83 78 77 74  Resp: (!) 22 16 (!) 22 (!) 26  Temp: 98 F (36.7 C) 98.1 F (36.7 C) 98.2 F (36.8 C) 98.6 F (37 C)  TempSrc: Oral Oral Oral Oral  SpO2: 96% 94% 95% 95%  Weight:      Height:       Filed Weights   08/31/16 1235  Weight: 39 kg (86 lb)   Body mass index is 15.73 kg/m.   General appearance :Awake, alert,Appears frail and chronically ill-appearing.  Eyes:, pupils equally reactive to light and accomodation,no scleral icterus.Pink conjunctiva HEENT: Atraumatic and Normocephalic Neck: supple, no JVD. No cervical lymphadenopathy. No thyromegaly Resp:Good air entry bilaterally, no added sounds  CVS: S1 S2 regular, GI: Bowel sounds +, soft but diffusely tender-but much improved compared to yesterday Extremities: B/L Lower Ext shows no edema, both legs are warm to touch Neurology:  speech clear,Non focal, sensation is  grossly intact. Psychiatric: Alert and oriented x 3. Normal mood. Musculoskeletal:No digital cyanosis Skin:No Rash, warm and dry Wounds:N/A  I have personally reviewed following labs and imaging studies  LABORATORY DATA: CBC:  Recent Labs Lab 08/31/16 1148 08/31/16 1159 09/01/16 0509 09/02/16 0621  WBC 5.8  --  7.3 6.3  NEUTROABS 5.1  --   --   --   HGB 9.7* 10.9* 7.1* 7.4*  HCT 31.3* 32.0* 23.5* 24.5*  MCV 87.7  --  88.3 88.8  PLT 305  --  263 [id number]    Basic Metabolic Panel:  Recent Labs Lab 08/31/16 1148 08/31/16 1159 08/31/16 1723 09/01/16 0509 09/02/16 0415  NA 135 136  --  142 140  K 4.6 4.4 4.9 4.3 3.9  CL 100* 101  --  110 111  CO2 21*  --   --  24 24  GLUCOSE 163* 162*  --  124* 110*  BUN 31* 30*  --  33* 26*  CREATININE 1.25* 1.00  --  1.11* 0.91  CALCIUM 8.5*  --   --  7.0* 7.5*    GFR: Estimated Creatinine Clearance: 22.8 mL/min (by C-G formula based on SCr of 0.91 mg/dL).  Liver Function Tests:  Recent Labs Lab 08/31/16 1148 09/01/16 0509  AST 38 19  ALT 11* 10*  ALKPHOS 69 49  BILITOT 0.7 0.5  PROT 5.4* 4.2*  ALBUMIN 3.2* 2.3*   No results for input(s): LIPASE, AMYLASE in the last 168 hours. No results for input(s): AMMONIA in the last 168 hours.  Coagulation Profile:  Recent Labs Lab 08/31/16 1148  INR 1.09    Cardiac Enzymes: No results for input(s): CKTOTAL, CKMB, CKMBINDEX, TROPONINI in the last 168 hours.  BNP (last 3 results) No results for input(s): PROBNP in the last 8760 hours.  HbA1C: No results for input(s): HGBA1C in the last 72 hours.  CBG: No results for input(s): GLUCAP in the last 168 hours.  Lipid Profile: No results for input(s): CHOL, HDL, LDLCALC, TRIG, CHOLHDL, LDLDIRECT in the last 72 hours.  Thyroid Function Tests: No results for input(s): TSH, T4TOTAL, FREET4, T3FREE, THYROIDAB in the last 72 hours.  Anemia Panel:  Recent Labs  09/01/16 1128 09/01/16 1129  VITAMINB12 2,542*  --   FOLATE   --  37.2  FERRITIN 69  --   TIBC 224*  --   IRON <5*  --   RETICCTPCT 1.3  --     Urine analysis:    Component Value Date/Time   COLORURINE AMBER (A) 08/31/2016 1549   APPEARANCEUR CLEAR 08/31/2016 1549   LABSPEC 1.042 (H) 08/31/2016 1549   PHURINE 5.5 08/31/2016 1549   GLUCOSEU NEGATIVE 08/31/2016 1549   HGBUR NEGATIVE 08/31/2016 1549   BILIRUBINUR NEGATIVE 08/31/2016 1549   KETONESUR NEGATIVE 08/31/2016 1549   PROTEINUR NEGATIVE 08/31/2016 1549   UROBILINOGEN 0.2 05/19/2015 1856   NITRITE NEGATIVE 08/31/2016 1549   LEUKOCYTESUR NEGATIVE 08/31/2016 1549    Sepsis Labs: Lactic Acid, Venous    Component Value Date/Time   LATICACIDVEN 1.1 09/01/2016 0509    MICROBIOLOGY: Recent Results (from the past 240 hour(s))  Blood culture (routine x 2)     Status: None (Preliminary result)   Collection Time: 08/31/16 12:20 PM  Result Value Ref Range Status   Specimen Description BLOOD RIGHT UPPER ARM  Final   Special Requests BOTTLES DRAWN AEROBIC AND ANAEROBIC 5CC  Final   Culture NO GROWTH 1 DAY  Final   Report Status PENDING  Incomplete  Blood culture (routine x 2)     Status: None (Preliminary result)   Collection Time: 08/31/16  2:53 PM  Result Value Ref Range Status   Specimen Description BLOOD RIGHT HAND  Final   Special Requests IN PEDIATRIC BOTTLE 2CC  Final   Culture NO GROWTH 1 DAY  Final   Report Status PENDING  Incomplete  MRSA PCR Screening     Status: Abnormal   Collection Time: 08/31/16  5:09 PM  Result Value Ref Range Status   MRSA by PCR POSITIVE (A) NEGATIVE Final    Comment:        The GeneXpert MRSA Assay (FDA approved for NASAL specimens only), is one component of a comprehensive MRSA colonization surveillance program. It is not intended to diagnose MRSA infection nor to guide or monitor treatment for MRSA infections. RESULT CALLED TO, READ BACK BY AND VERIFIED WITH: M.YORK,RN AT 2149 BY L.PITT 08/31/16     RADIOLOGY STUDIES/RESULTS: Dg  Chest 1 View  Result Date: 08/31/2016 CLINICAL DATA:  Chest pain  EXAM: CHEST 1 VIEW COMPARISON:  07/13/2012 FINDINGS: Heart is normal size. Mediastinal contours are within normal limits. Calcifications within the aortic arch. Mild interstitial prominence throughout the lungs, new since prior study. No effusions or acute bony abnormality. IMPRESSION: New mild interstitial prominence throughout the lungs which could reflect chronic interstitial lung disease. Electronically Signed   By: Rolm Baptise M.D.   On: 08/31/2016 13:03   Ct Head Wo Contrast  Result Date: 08/31/2016 CLINICAL DATA:  Altered mental status EXAM: CT HEAD WITHOUT CONTRAST TECHNIQUE: Contiguous axial images were obtained from the base of the skull through the vertex without intravenous contrast. COMPARISON:  05/19/2015 FINDINGS: Brain: There is atrophy and chronic small vessel disease changes. No acute intracranial abnormality. Specifically, no hemorrhage, hydrocephalus, mass lesion, acute infarction, or significant intracranial injury. Vascular: No hyperdense vessel or unexpected calcification. Skull: No acute calvarial abnormality. Sinuses/Orbits: Visualized paranasal sinuses and mastoids clear. Orbital soft tissues unremarkable. Other: None IMPRESSION: No acute intracranial abnormality. Atrophy, chronic microvascular disease. Electronically Signed   By: Rolm Baptise M.D.   On: 08/31/2016 13:06   Mr Brain Wo Contrast  Result Date: 09/01/2016 CLINICAL DATA:  Slurred speech. EXAM: MRI HEAD WITHOUT CONTRAST TECHNIQUE: Multiplanar, multiecho pulse sequences of the brain and surrounding structures were obtained without intravenous contrast. COMPARISON:  CT head 08/31/2016 FINDINGS: Brain: Advanced cerebral atrophy. Ventricular enlargement consistent with atrophy. Negative for acute infarct. Mild chronic microvascular ischemic change. Negative for hemorrhage Right frontal parafalcine extra-axial mass lesion compatible with meningioma. This  measures 8 x 15 mm. No edema in the adjacent brain. Normal pituitary. Vascular: Normal arterial flow void. Skull and upper cervical spine: Negative Sinuses/Orbits: Negative Other: None IMPRESSION: No acute infarct.  Mild chronic microvascular ischemic change Advanced atrophy Right frontal parafalcine meningioma measuring 8 x 15 mm. Electronically Signed   By: Franchot Gallo M.D.   On: 09/01/2016 10:02   Ct Abdomen Pelvis W Contrast  Result Date: 08/31/2016 CLINICAL DATA:  83 year old with nausea, vomiting and pain. EXAM: CT ABDOMEN AND PELVIS WITH CONTRAST TECHNIQUE: Multidetector CT imaging of the abdomen and pelvis was performed using the standard protocol following bolus administration of intravenous contrast. CONTRAST:  11mL ISOVUE-300 IOPAMIDOL (ISOVUE-300) INJECTION 61% COMPARISON:  06/13/2016 FINDINGS: Lower chest: Mild dependent atelectasis at the lung bases. No large pleural effusions. Small-moderate sized hiatal hernia. Right pleural effusion has resolved from the previous examination. Hepatobiliary: Small amount of perihepatic ascites. Otherwise, normal appearance of the liver, gallbladder and portal venous system is patent. No significant biliary dilatation. Pancreas: Normal appearance of the pancreas without inflammation or duct dilatation. Spleen: Normal appearance of spleen without enlargement. Adrenals/Urinary Tract: Normal adrenal glands. Again noted is an extrarenal pelvis in both kidneys. Tiny cyst in the right kidney lower pole. There is no significant hydronephrosis. Limited evaluation of the urinary bladder due to artifact from the left hip arthroplasty. There is fluid in the urinary bladder. Probable small cyst in left kidney. Stomach/Bowel: Small to moderate sized hiatal hernia. Multiple diverticula involving the sigmoid colon. Dilated and fluid-filled loops of small bowel with wall enhancement. There are fluid-filled and mildly dilated loops of colon. The appendix is a mildly distended  and fluid-filled, best seen on sequence 2, image 61. Appendix roughly measures 0.8 cm in diameter. No definite inflammatory changes around the appendix. Limited evaluation of the ileocecal valve. There is concern for mild focal narrowing of the right colon on sequence 2, image 50. Vascular/Lymphatic: Atherosclerotic calcifications in aorta without aneurysm. No significant lymph node enlargement in  the abdomen or pelvis. Reproductive: Uterus is not confidently identified but this area is poorly characterized due to artifact from the left hip replacement. Other: Small amount of free fluid in the pelvis and right lower quadrant. Small amount of fluid around the liver. Musculoskeletal: Left hip arthroplasty is located. Disc space narrowing along the anterior aspect of L1-L2. IMPRESSION: Again noted are dilating fluid-filled loops of distal small bowel and colon. A transition point is not clearly identified and findings may be associated with an ileus. Again noted is a small amount of abdominal and pelvic ascites. There is wall enhancement of the distal small bowel and enteritis cannot be excluded. Focal narrowing and wall thickening in the right colon. This colonic narrowing is more prominent than the previous examination and this could represent focal peristalsis. Colonic lesion in this area cannot be excluded. Hiatal hernia. Electronically Signed   By: Markus Daft M.D.   On: 08/31/2016 13:25   Dg Abd 2 Views  Result Date: 09/01/2016 CLINICAL DATA:  Abdominal pain and constipation EXAM: ABDOMEN - 2 VIEW COMPARISON:  CT abdomen and pelvis August 31, 2016 FINDINGS: Supine and left lateral decubitus images obtained. There is mild stool in the colon. There is no appreciable bowel dilatation or air-fluid level to suggest bowel obstruction. No free air. There is a total hip prosthesis on the left. There is evidence of old healed fracture of the right posterior eleventh rib. IMPRESSION: Bowel gas pattern unremarkable  without evident bowel obstruction or free air. Electronically Signed   By: Lowella Grip III M.D.   On: 09/01/2016 08:38     LOS: 2 days   Oren Binet, MD  [facility] Pager:[phone]  If 7PM-7AM, please contact night-coverage [email or web address] Password [email or web address] 09/02/2016, 11:14 AM

## 2016-09-02 NOTE — Progress Notes (Signed)
MEDICATION RELATED CONSULT NOTE - INITIAL   Pharmacy Consult for  IV IRON replacement  Indication: Anemia  No Known Allergies  Patient Measurements: Height: 5\' 2"  (157.5 cm) Weight: 86 lb (39 kg) IBW/kg (Calculated) : 50.1   Vital Signs: Temp: 98.2 F (36.8 C) (11/16 1228) Temp Source: Oral (11/16 1228) BP: 122/59 (11/16 1228) Pulse Rate: 70 (11/16 1228) Intake/Output from previous day: 11/15 0701 - 11/16 0700 In: 1219.7 [P.O.:600; I.V.:369.7; IV Piggyback:250] Out: 200 [Urine:200] Intake/Output from this shift: No intake/output data recorded.  Labs:  Recent Labs  08/31/16 1148 08/31/16 1159 09/01/16 0509 09/02/16 0415 09/02/16 0621  WBC 5.8  --  7.3  --  6.3  HGB 9.7* 10.9* 7.1*  --  7.4*  HCT 31.3* 32.0* 23.5*  --  24.5*  PLT 305  --  263  --  275  APTT 24  --   --   --   --   CREATININE 1.25* 1.00 1.11* 0.91  --   ALBUMIN 3.2*  --  2.3*  --   --   PROT 5.4*  --  4.2*  --   --   AST 38  --  19  --   --   ALT 11*  --  10*  --   --   ALKPHOS 69  --  49  --   --   BILITOT 0.7  --  0.5  --   --    Estimated Creatinine Clearance: 22.8 mL/min (by C-G formula based on SCr of 0.91 mg/dL).   Microbiology: Recent Results (from the past 720 hour(s))  Blood culture (routine x 2)     Status: None (Preliminary result)   Collection Time: 08/31/16 12:20 PM  Result Value Ref Range Status   Specimen Description BLOOD RIGHT UPPER ARM  Final   Special Requests BOTTLES DRAWN AEROBIC AND ANAEROBIC 5CC  Final   Culture NO GROWTH 1 DAY  Final   Report Status PENDING  Incomplete  Blood culture (routine x 2)     Status: None (Preliminary result)   Collection Time: 08/31/16  2:53 PM  Result Value Ref Range Status   Specimen Description BLOOD RIGHT HAND  Final   Special Requests IN PEDIATRIC BOTTLE 2CC  Final   Culture NO GROWTH 1 DAY  Final   Report Status PENDING  Incomplete  MRSA PCR Screening     Status: Abnormal   Collection Time: 08/31/16  5:09 PM  Result Value Ref  Range Status   MRSA by PCR POSITIVE (A) NEGATIVE Final    Comment:        The GeneXpert MRSA Assay (FDA approved for NASAL specimens only), is one component of a comprehensive MRSA colonization surveillance program. It is not intended to diagnose MRSA infection nor to guide or monitor treatment for MRSA infections. RESULT CALLED TO, READ BACK BY AND VERIFIED WITH: M.YORK,RN AT 2149 BY L.PITT 08/31/16     Medical History: Past Medical History:  Diagnosis Date  . Anemia "always"  . Anxiety   . Arthritis    "back" (05/19/2015)  . Bowel obstruction   . Chronic lower back pain    "qd for the last month" (05/19/2015)  . History of blood transfusion 05/19/2015   "low HgB"  . Melanoma of face ([place])    "skin only; no other treatment"  . Pneumonia X 1    Medications:  Prescriptions Prior to Admission  Medication Sig Dispense Refill Last Dose  . acetaminophen (TYLENOL) 325 MG tablet  Take 2 tablets (650 mg total) by mouth every 6 (six) hours as needed for mild pain (or Fever >/= 101).   Past Month at Unknown time  . acetaminophen (TYLENOL) 500 MG tablet Take 500 mg by mouth 2 (two) times daily.   08/30/2016 at Unknown time  . Cholecalciferol (VITAMIN D3) 1000 units CAPS Take 1,000 Units by mouth 2 (two) times daily.   08/30/2016 at Unknown time  . Cranberry 500 MG CHEW Chew 500 mg by mouth daily.   08/30/2016 at Unknown time  . feeding supplement, ENSURE ENLIVE, (ENSURE ENLIVE) LIQD Take 237 mLs by mouth daily.   08/30/2016 at Unknown time  . ferrous sulfate 325 (65 FE) MG tablet Take 325 mg by mouth daily with breakfast.   08/30/2016 at Unknown time  . furosemide (LASIX) 20 MG tablet Take 20 mg by mouth daily as needed for fluid or edema.   Past Month at Unknown time  . metoprolol tartrate (LOPRESSOR) 25 MG tablet Take 1 tablet (25 mg total) by mouth 2 (two) times daily. 60 tablet 0 08/30/2016 at 2200  . Multiple Vitamin (MULTIVITAMIN WITH MINERALS) TABS tablet Take 1 tablet by mouth  daily.   08/30/2016 at Unknown time  . ondansetron (ZOFRAN) 4 MG tablet Take 1 tablet (4 mg total) by mouth every 6 (six) hours as needed for nausea. 20 tablet 0 Past Month at Unknown time  . pantoprazole (PROTONIX) 40 MG tablet Take 1 tablet (40 mg total) by mouth daily. 30 tablet 0 08/30/2016 at Unknown time  . traMADol (ULTRAM) 50 MG tablet Take 0.5 tablets (25 mg total) by mouth every 6 (six) hours as needed for moderate pain or severe pain. 30 tablet 0 Past Month at Unknown time  . vitamin C (ASCORBIC ACID) 500 MG tablet Take 500 mg by mouth daily.   08/30/2016 at Unknown time  . cyclobenzaprine (FLEXERIL) 5 MG tablet Take 1 tablet (5 mg total) by mouth 3 (three) times daily as needed for muscle spasms. (Patient not taking: Reported on 08/31/2016) 30 tablet 0 Not Taking at Unknown time    Assessment: 82 y.o female with worsening Hgb  Hgb 9.7 >> 7.1 >7.4 IV Iron per pharmacy consult.  MD noted worsening hgb likely secondary to acute illness and IV fluid dilution Has h/o chronic iron defic anemia from chronic GI loss,  Iron panel results c/w iron deficiency : Iron <5, TIBC 224 low, Ferritin wnl  Ferritin  has slowly improved over the past several months-she is on iron supplementation at home (once daily FeSo4 325mg ) No overt evidence of blood loss at this time per Dr. Sloan Leiter She has received Feraheme IV in the past (05/20/15) per EPIC review   Goal of Therapy:  Improvement in Hgb  Plan:  Feraheme 510 mg IV x1 F/u CBC Consider increasing Ferrous sulfate oral dose to [place], RPh Clinical Pharmacist [phone] or [phone] ([id number]) Main Rx [phone] 09/02/2016,2:41 PM

## 2016-09-03 ENCOUNTER — Inpatient Hospital Stay (HOSPITAL_COMMUNITY): Payer: Medicare Other

## 2016-09-03 DIAGNOSIS — K529 Noninfective gastroenteritis and colitis, unspecified: Secondary | ICD-10-CM | POA: Diagnosis present

## 2016-09-03 DIAGNOSIS — K639 Disease of intestine, unspecified: Secondary | ICD-10-CM

## 2016-09-03 DIAGNOSIS — K6389 Other specified diseases of intestine: Secondary | ICD-10-CM | POA: Diagnosis present

## 2016-09-03 LAB — BASIC METABOLIC PANEL
ANION GAP: 8 (ref 5–15)
BUN: 16 mg/dL (ref 6–20)
CHLORIDE: 107 mmol/L (ref 101–111)
CO2: 23 mmol/L (ref 22–32)
Calcium: 8.1 mg/dL — ABNORMAL LOW (ref 8.9–10.3)
Creatinine, Ser: 0.75 mg/dL (ref 0.44–1.00)
GFR calc Af Amer: >60 mL/min (ref >60)
GFR calc non Af Amer: >60 mL/min (ref >60)
GLUCOSE: 114 mg/dL — AB (ref 65–99)
POTASSIUM: 3.7 mmol/L (ref 3.5–5.1)
Sodium: 138 mmol/L (ref 135–145)

## 2016-09-03 LAB — CBC
HEMATOCRIT: 25.8 % — AB (ref 36.0–46.0)
Hemoglobin: 7.8 g/dL — ABNORMAL LOW (ref 12.0–15.0)
MCH: 26.9 pg (ref 26.0–34.0)
MCHC: 30.2 g/dL (ref 30.0–36.0)
MCV: 89 fL (ref 78.0–100.0)
Platelets: 220 10*3/uL (ref 150–400)
RBC: 2.9 MIL/uL — AB (ref 3.87–5.11)
RDW: 14.2 % (ref 11.5–15.5)
WBC: 7.8 10*3/uL (ref 4.0–10.5)

## 2016-09-03 LAB — GLUCOSE, CAPILLARY: Glucose-Capillary: 108 mg/dL — ABNORMAL HIGH (ref 65–99)

## 2016-09-03 MED ORDER — FERROUS SULFATE 325 (65 FE) MG PO TABS
325.0000 mg | ORAL_TABLET | Freq: Three times a day (TID) | ORAL | Status: DC
  Administered 2016-09-03 – 2016-09-05 (×5): 325 mg via ORAL
  Filled 2016-09-03 (×5): qty 1

## 2016-09-03 NOTE — Progress Notes (Signed)
BaE shows probable obstruction mass in proximal colon. Given the clinical scenario of presenting w/ anemia and SBO, it is highly likely that this is a cancer.   Have discussed w/ Dr. Maylene Roes.   The big question is whether this elderly, frail pt who presented with sx (anemia, SBO) should have surgery--and who should make that decision.    In my opinion, the patient, who is not by any means moribund, would be best served by surgery, primarily not to extend life, so much as palliation by way of preventing fairly predictable, severely symptomatic bad complications such as complete bowel obstruction.  I will sign off, but would be happy to discuss the situation with family members if the attending Hospitalist team feels it would be help for me to do so.  Cleotis Nipper, M.D. Pager [phone] If no answer or after 5 PM call [phone] .

## 2016-09-03 NOTE — NC FL2 (Signed)
[place] LEVEL OF CARE SCREENING TOOL     IDENTIFICATION  Patient Name: Kelly Porter Birthdate: January 19, 1919 Sex: female Admission Date (Current Location): 08/31/2016  [facility] and [place] Number:  Herbalist and Address:  [facility], [place] [address], [place], [place] [address]      Provider Number: [id number]  Attending Physician Name and Address:  Shon Millet*  Relative Name and Phone Number:       Current Level of Care: Hospital Recommended Level of Care: [place] Prior Approval Number:    Date Approved/Denied:   PASRR Number: [id number]  Discharge Plan: SNF    Current Diagnoses: Patient Active Problem List   Diagnosis Date Noted  . Mass of colon 09/03/2016  . Enteritis 09/03/2016  . Symptomatic anemia   . Severe sepsis with septic shock ([place]) 08/31/2016  . Sepsis ([place]) 08/31/2016  . Bowel obstruction 08/31/2016  . AKI (acute kidney injury) ([place]) 08/31/2016  . Slurred speech 08/31/2016  . Hypertension 08/31/2016  . Chronic pain 08/31/2016  . Ileus ([place]) 06/13/2016  . Back pain   . Iron deficiency anemia due to chronic blood loss   . Fall (on)(from) incline, initial encounter   . Chronic GI bleeding     Orientation RESPIRATION BLADDER Height & Weight     Self, Place  Normal Continent Weight: 47.4 kg (104 lb 8 oz) Height:  5\' 2"  (157.5 cm)  BEHAVIORAL SYMPTOMS/MOOD NEUROLOGICAL BOWEL NUTRITION STATUS      Continent Diet (Please see DC Summary)  AMBULATORY STATUS COMMUNICATION OF NEEDS Skin   Limited Assist Verbally Normal                       Personal Care Assistance Level of Assistance  Bathing, Feeding, Dressing Bathing Assistance: Maximum assistance Feeding assistance: Limited assistance Dressing Assistance: Limited assistance     Functional Limitations Info             SPECIAL CARE FACTORS FREQUENCY  PT (By licensed PT)     PT Frequency: not yet  assessed              Contractures      Additional Factors Info  Code Status, Allergies Code Status Info: DNR Allergies Info: NKA           Current Medications (09/03/2016):  This is the current hospital active medication list Current Facility-Administered Medications  Medication Dose Route Frequency Provider Last Rate Last Dose  . acetaminophen (TYLENOL) tablet 650 mg  650 mg Oral Q6H PRN Waldemar Dickens, MD       Or  . acetaminophen (TYLENOL) suppository 650 mg  650 mg Rectal Q6H PRN Waldemar Dickens, MD      . ampicillin-sulbactam (UNASYN) 1.5 g in sodium chloride 0.9 % 50 mL IVPB  1.5 g Intravenous Q12H Jonetta Osgood, MD   1.5 g at 09/03/16 1612  . Chlorhexidine Gluconate Cloth 2 % PADS 6 each  6 each Topical Q0600 Jonetta Osgood, MD   6 each at 09/03/16 [phone]  . cholecalciferol (VITAMIN D) tablet 1,000 Units  1,000 Units Oral BID Jonetta Osgood, MD   1,000 Units at 09/03/16 1227  . feeding supplement (ENSURE ENLIVE) (ENSURE ENLIVE) liquid 237 mL  237 mL Oral TID BM Jonetta Osgood, MD   237 mL at 09/03/16 1242  . ferrous sulfate tablet 325 mg  325 mg Oral TID WC Shon Millet,  DO   325 mg at 09/03/16 1615  . heparin injection 5,000 Units  5,000 Units Subcutaneous Q8H Waldemar Dickens, MD   5,000 Units at 09/03/16 1355  . metoprolol tartrate (LOPRESSOR) tablet 12.5 mg  12.5 mg Oral BID Jonetta Osgood, MD   12.5 mg at 09/03/16 1227  . multivitamin with minerals tablet 1 tablet  1 tablet Oral Daily Jonetta Osgood, MD   1 tablet at 09/03/16 1227  . mupirocin ointment (BACTROBAN) 2 % 1 application  1 application Nasal BID Jonetta Osgood, MD   1 application at 0000000 1224  . ondansetron (ZOFRAN) tablet 4 mg  4 mg Oral Q6H PRN Waldemar Dickens, MD       Or  . ondansetron [facility]) injection 4 mg  4 mg Intravenous Q6H PRN Waldemar Dickens, MD      . pantoprazole (PROTONIX) EC tablet 40 mg  40 mg Oral Daily Jonetta Osgood, MD   40 mg at 09/03/16 1227   . sodium chloride flush (NS) 0.9 % injection 3 mL  3 mL Intravenous Q12H Waldemar Dickens, MD   3 mL at 09/03/16 1223  . sodium phosphate (FLEET) 7-19 GM/118ML enema 1 enema  1 enema Rectal Once Ronald Lobo, MD      . traMADol Veatrice Bourbon) tablet 25 mg  25 mg Oral Q6H PRN Waldemar Dickens, MD      . vitamin C (ASCORBIC ACID) tablet 500 mg  500 mg Oral Daily Jonetta Osgood, MD   500 mg at 09/03/16 1227     Discharge Medications: Please see discharge summary for a list of discharge medications.  Relevant Imaging Results:  Relevant Lab Results:   Additional Information ss: [id number]  Benard Halsted, LCSWA

## 2016-09-03 NOTE — Progress Notes (Signed)
PROGRESS NOTE    Kelly Porter  [id number] DOB: 1919-02-01 DOA: 08/31/2016 PCP: Henrine Screws, MD     Brief Narrative:  Patient is a 82 y.o. female with past medical history of chronic iron deficiency anemia due to GI loss, chronic anxiety who presented to the hospital with confusion. She was thought to have sepsis from unknown foci along with encephalopathy and admitted for further evaluation and treatment.   Assessment & Plan:   Principal Problem:   Mass of colon Active Problems:   Severe sepsis with septic shock (HCC)   Sepsis (HCC)   Bowel obstruction   AKI (acute kidney injury) ([place])   Slurred speech   Hypertension   Chronic pain   Symptomatic anemia   Enteritis  Sepsis: Likely secondary to enteritis. Sepsis pathophysiology seems to have resolved. Blood cultures are negative so far. Antibiotics deescalated to Unasyn.   Right colonic thickening, concern for colonic mass: GI following. Barium enema today showed obstructing lesion in the proximal to mid ascending colon, highly suspicious for obstructing colon carcinoma. Spoke at length with Dr. Cristina Gong, patient, and niece today. Patient has decided to not pursue further work up or treatment for this new colon mass. Wants to meet with Palliative care.   Normocytic anemia: Worsening hemoglobin likely secondary to acute illness and IV fluid dilution. Has history of chronic iron deficiency anemia from  chronic GI loss. Iron panel, shows anemia most consistent with iron deficiency. Received feraheme and continue PO supplement TID.   Acute metabolic encephalopathy: Likely secondary to above, encephalopathy has resolved. CT head/MRI brain negative for acute abnormalities including CVA.  Acute kidney injury: Likely hemodynamically mediated, resolved with IV fluids  Hypertension: Continue lopressor   Chronic back pain: Continue as needed tramadol.   DVT prophylaxis: subq hep Code Status: DNR Family Communication:  spoke with niece over the phone today  Disposition Plan: SNF, pending PT/OT eval. Palliative care consulted.    Consultants:   GI  Palliative Care   Procedures:   None  Antimicrobials:  Anti-infectives    Start     Dose/Rate Route Frequency Ordered Stop   09/02/16 1330  ampicillin-sulbactam (UNASYN) 1.5 g in sodium chloride 0.9 % 50 mL IVPB     1.5 g 100 mL/hr over 30 Minutes Intravenous Every 12 hours 09/02/16 1231     09/01/16 1400  vancomycin (VANCOCIN) 500 mg in sodium chloride 0.9 % 100 mL IVPB  Status:  Discontinued     500 mg 100 mL/hr over 60 Minutes Intravenous Every 24 hours 08/31/16 1258 09/02/16 1121   08/31/16 2120  piperacillin-tazobactam (ZOSYN) IVPB 2.25 g  Status:  Discontinued     2.25 g 100 mL/hr over 30 Minutes Intravenous Every 8 hours 08/31/16 2001 09/02/16 1121   08/31/16 2100  piperacillin-tazobactam (ZOSYN) IVPB 2.25 g  Status:  Discontinued     2.25 g 100 mL/hr over 30 Minutes Intravenous Every 6 hours 08/31/16 1258 08/31/16 2001   08/31/16 1245  vancomycin (VANCOCIN) IVPB 750 mg/150 ml premix     750 mg 150 mL/hr over 60 Minutes Intravenous  Once 08/31/16 1241 08/31/16 1450   08/31/16 1230  piperacillin-tazobactam (ZOSYN) IVPB 3.375 g     3.375 g 100 mL/hr over 30 Minutes Intravenous  Once 08/31/16 1223 08/31/16 1357   08/31/16 1230  vancomycin (VANCOCIN) IVPB 1000 mg/200 mL premix  Status:  Discontinued     1,000 mg 200 mL/hr over 60 Minutes Intravenous  Once 08/31/16 1223 08/31/16 1837  Subjective: Patient feeling well after barium enema procedure today. Denies any fevers, chest pain, cough, shortness of breath, nausea, vomiting. Has intermittent abdominal pain.   Objective: Vitals:   09/02/16 1457 09/02/16 1701 09/02/16 2129 09/03/16 0615  BP: 127/77 (!) 122/59 130/66 (!) 147/63  Pulse: 83 86 90 85  Resp: (!) 23 18 17 18   Temp: 97.6 F (36.4 C) 97.8 F (36.6 C) 98.5 F (36.9 C) 97.8 F (36.6 C)  TempSrc: Oral Oral Oral Oral    SpO2: 93% 94% 91% 94%  Weight:  47.4 kg (104 lb 8 oz)    Height:        Intake/Output Summary (Last 24 hours) at 09/03/16 1309 Last data filed at 09/03/16 1235  Gross per 24 hour  Intake              770 ml  Output              650 ml  Net              120 ml   Filed Weights   08/31/16 1235 09/02/16 1701  Weight: 39 kg (86 lb) 47.4 kg (104 lb 8 oz)    Examination:  General exam: Appears calm and comfortable, thin elderly female  Respiratory system: Clear to auscultation. Respiratory effort normal. Cardiovascular system: S1 & S2 heard, RRR. No JVD, murmurs, rubs, gallops or clicks. No pedal edema. Gastrointestinal system: Abdomen is nondistended, soft and nontender to palpation. No organomegaly or masses felt. Normal bowel sounds heard. Central nervous system: Alert and oriented. No focal neurological deficits. Extremities: Symmetric 5 x 5 power. Skin: No rashes, lesions or ulcers Psychiatry: Judgement and insight appear normal. Mood & affect appropriate.   Data Reviewed: I have personally reviewed following labs and imaging studies  CBC:  Recent Labs Lab 08/31/16 1148 08/31/16 1159 09/01/16 0509 09/02/16 0621 09/03/16 0801  WBC 5.8  --  7.3 6.3 7.8  NEUTROABS 5.1  --   --   --   --   HGB 9.7* 10.9* 7.1* 7.4* 7.8*  HCT 31.3* 32.0* 23.5* 24.5* 25.8*  MCV 87.7  --  88.3 88.8 89.0  PLT 305  --  263 275 [id number]   Basic Metabolic Panel:  Recent Labs Lab 08/31/16 1148 08/31/16 1159 08/31/16 1723 09/01/16 0509 09/02/16 0415 09/03/16 0801  NA 135 136  --  142 140 138  K 4.6 4.4 4.9 4.3 3.9 3.7  CL 100* 101  --  110 111 107  CO2 21*  --   --  24 24 23   GLUCOSE 163* 162*  --  124* 110* 114*  BUN 31* 30*  --  33* 26* 16  CREATININE 1.25* 1.00  --  1.11* 0.91 0.75  CALCIUM 8.5*  --   --  7.0* 7.5* 8.1*   GFR: Estimated Creatinine Clearance: 31.5 mL/min (by C-G formula based on SCr of 0.75 mg/dL). Liver Function Tests:  Recent Labs Lab 08/31/16 1148  09/01/16 0509  AST 38 19  ALT 11* 10*  ALKPHOS 69 49  BILITOT 0.7 0.5  PROT 5.4* 4.2*  ALBUMIN 3.2* 2.3*   No results for input(s): LIPASE, AMYLASE in the last 168 hours. No results for input(s): AMMONIA in the last 168 hours. Coagulation Profile:  Recent Labs Lab 08/31/16 1148  INR 1.09   Cardiac Enzymes: No results for input(s): CKTOTAL, CKMB, CKMBINDEX, TROPONINI in the last 168 hours. BNP (last 3 results) No results for input(s): PROBNP in the last 8760 hours.  HbA1C: No results for input(s): HGBA1C in the last 72 hours. CBG:  Recent Labs Lab 09/03/16 0800  GLUCAP 108*   Lipid Profile: No results for input(s): CHOL, HDL, LDLCALC, TRIG, CHOLHDL, LDLDIRECT in the last 72 hours. Thyroid Function Tests: No results for input(s): TSH, T4TOTAL, FREET4, T3FREE, THYROIDAB in the last 72 hours. Anemia Panel:  Recent Labs  09/01/16 1128 09/01/16 1129  VITAMINB12 2,542*  --   FOLATE  --  37.2  FERRITIN 69  --   TIBC 224*  --   IRON <5*  --   RETICCTPCT 1.3  --    Sepsis Labs:  Recent Labs Lab 08/31/16 1453 08/31/16 1501 08/31/16 1723 08/31/16 2115 09/01/16 0509 09/02/16 0415  PROCALCITON 22.31  --   --   --   --  11.55  LATICACIDVEN  --  3.14* 3.1* 2.6* 1.1  --     Recent Results (from the past 240 hour(s))  Blood culture (routine x 2)     Status: None (Preliminary result)   Collection Time: 08/31/16 12:20 PM  Result Value Ref Range Status   Specimen Description BLOOD RIGHT UPPER ARM  Final   Special Requests BOTTLES DRAWN AEROBIC AND ANAEROBIC 5CC  Final   Culture NO GROWTH 2 DAYS  Final   Report Status PENDING  Incomplete  Blood culture (routine x 2)     Status: None (Preliminary result)   Collection Time: 08/31/16  2:53 PM  Result Value Ref Range Status   Specimen Description BLOOD RIGHT HAND  Final   Special Requests IN PEDIATRIC BOTTLE 2CC  Final   Culture NO GROWTH 2 DAYS  Final   Report Status PENDING  Incomplete  MRSA PCR Screening      Status: Abnormal   Collection Time: 08/31/16  5:09 PM  Result Value Ref Range Status   MRSA by PCR POSITIVE (A) NEGATIVE Final    Comment:        The GeneXpert MRSA Assay (FDA approved for NASAL specimens only), is one component of a comprehensive MRSA colonization surveillance program. It is not intended to diagnose MRSA infection nor to guide or monitor treatment for MRSA infections. RESULT CALLED TO, READ BACK BY AND VERIFIED WITH: M.YORK,RN AT 2149 BY L.PITT 08/31/16        Radiology Studies: Dg Colon W/cm - Wo/w Kub  Result Date: 09/03/2016 CLINICAL DATA:  Anemia. Distal small bowel obstruction. Right colonic mass suspected on recent CT. EXAM: SINGLE COLUMN BARIUM ENEMA TECHNIQUE: Initial scout AP supine abdominal image obtained to insure adequate colon cleansing. Barium was introduced into the colon in a retrograde fashion and refluxed from the rectum to the cecum. Spot images of the colon followed by overhead radiographs were obtained. FLUOROSCOPY TIME:  Fluoroscopy Time: 3 minutes 55 seconds ; low dose pulsed fluoroscopy Number of Acquired Spot Images: 0 COMPARISON:  CT on 08/31/2016 FINDINGS: Scout radiograph shows mild dilatation of distal small bowel loops, suspicious for partial distal small bowel obstruction as demonstrated on recent CT. Single column barium enema was performed. Severe diverticulosis is seen predominately involving the descending and sigmoid colon. No evidence of diverticulitis. Complete colonic obstruction is seen involving the proximal to mid ascending colon. No opacification of the cecum is seen. This has an abrupt masslike margin, and corresponds with the site of suspected mass on recent CT. This is highly suspicious for obstructing colon carcinoma. No other colonic masses or strictures identified. IMPRESSION: Obstructing lesion in the proximal to mid ascending colon, highly suspicious for  obstructing colon carcinoma. No other colonic masses or strictures  identified. Severe diverticulosis involving the descending and sigmoid colon. These results were discussed by telephone at the time of interpretation on 09/03/2016 at 10:31 am with Dr. Cristina Gong, who verbally acknowledged these results. Electronically Signed   By: Earle Gell M.D.   On: 09/03/2016 10:33      Scheduled Meds: . ampicillin-sulbactam (UNASYN) IV  1.5 g Intravenous Q12H  . Chlorhexidine Gluconate Cloth  6 each Topical Q0600  . cholecalciferol  1,000 Units Oral BID  . feeding supplement (ENSURE ENLIVE)  237 mL Oral TID BM  . ferrous sulfate  325 mg Oral TID WC  . heparin  5,000 Units Subcutaneous Q8H  . metoprolol tartrate  12.5 mg Oral BID  . multivitamin with minerals  1 tablet Oral Daily  . mupirocin ointment  1 application Nasal BID  . pantoprazole  40 mg Oral Daily  . sodium chloride flush  3 mL Intravenous Q12H  . sodium phosphate  1 enema Rectal Once  . vitamin C  500 mg Oral Daily   Continuous Infusions:    LOS: 3 days    Time spent: 40 minutes   Dessa Phi, DO [facility] [email or web address] Password [email or web address] 09/03/2016, 1:09 PM

## 2016-09-03 NOTE — Progress Notes (Signed)
Addendum to previous note:  I was able to get by the patient's room and reviewed in detail the radiographic findings and the options for management, strongly recommending that the patient consider surgery to help prevent complications of pain and suffering in the near future. She seems to have reasonably good understanding, at least at the time I spoke with her, and wants to talk things over with her niece.  Case discussed with Dr. Maylene Roes.  In the event that the patient does opt for surgery, I would not advocate preoperative colonoscopy unless the surgeon strongly desires it, because (given the patient's advanced age, small body size, and multiple diverticula) I think it would add risk without much benefit.  We will sign off, but will be on standby to talk with the patient's niece or other caregivers or health team members if it would be helpful to do so.  Cleotis Nipper, M.D. Pager [phone] If no answer or after 5 PM call [phone]

## 2016-09-03 NOTE — Progress Notes (Signed)
PT Cancellation Note  Patient Details Name: BONI DOWNIE MRN: [id number] DOB: 1919-02-09   Cancelled Treatment:    Reason Eval/Treat Not Completed: Patient at procedure or test/unavailable. Pt off floor for procedure. PT to follow up as time allows.   Lorriane Shire 09/03/2016, 8:59 AM

## 2016-09-04 LAB — PROCALCITONIN: PROCALCITONIN: 2.93 ng/mL

## 2016-09-04 MED ORDER — AMOXICILLIN-POT CLAVULANATE 875-125 MG PO TABS
1.0000 | ORAL_TABLET | Freq: Two times a day (BID) | ORAL | Status: DC
  Administered 2016-09-04 – 2016-09-05 (×3): 1 via ORAL
  Filled 2016-09-04 (×3): qty 1

## 2016-09-04 NOTE — Consult Note (Signed)
Consultation Note Date: 09/04/2016   Patient Name: Kelly Porter  DOB: [birth year]  MRN: [id number]  Age / Sex: 82 y.o., female  PCP: Josetta Huddle, MD Referring Physician: Shon Millet*  Reason for Consultation: Establishing goals of care  HPI/Patient Profile: 82 y.o. female    admitted on 08/31/2016    Clinical Assessment and Goals of Care:  82 year old lady past medical history significant for chronic iron deficiency anemia due to GI loss, chronic anxiety, has been admitted for sepsis likely secondary to enteritis. She has been placed on antibiotics. Abdominal workup has shown right colonic thickening concern for right colon mass. She has been seen and evaluated by gastroenterology. Barium enema showing obstructing lesion in the proximal to mid ascending colon highly suspicious for obstructing colon carcinoma. Patient had acute metabolic encephalopathy and acute kidney injury at the time of initial presentation which has resolved to a great extent during the course of this hospitalization. Palliative consultation for goals of care discussions.  Patient is a sweet elderly lady resting in bed. She is awake and alert. She is hard of hearing. She is finishing her breakfast. She states that it is hard to rest in the hospital however she did sleep some overnight. I introduced myself and palliative care as follows: Palliative medicine is specialized medical care for people living with serious illness. It focuses on providing relief from the symptoms and stress of a serious illness. The goal is to improve quality of life for both the patient and the family.  Patient is resting in bed. She states that she has been told that she possibly has colon cancer. She complains of episodic abdominal discomfort. She states it comes and goes. She is passing gas denies constipation. Denies nausea. Brief life review  performed. Patient's husband died 11 years ago. She did not have any children. She lives in [place], [place] nieces and nephews live nearby and are closely involved in her care. Patient has a lot of spiritual strength. She states that she has led a good life. She believes she is ready to die peacefully. She used to work at [employer] and later at [employer] several decades ago.  Discussed about concern for colonic mass on the right colon, ascending colon. Disease trajectory of obstructing colon carcinoma discussed at length. She is appreciative of information received from gastroenterology specialists. Patient states that she has made the decision not to undergo surgery. Discussed extensively. Call placed and discussed with niece Kelly Porter healthcare power of attorney agent at [phone]. Kelly Porter agrees with the patient's decision. Both patient and niece would like for the patient to undergo a brief trial of skilled nursing facility rehabilitation attempt and accepting of palliative services over there. Subsequently, they wish to transition home with 24 7 support and hospice services following. All questions answered to the best of my ability. See recommendations below. Thank you for the consult.  HCPOA  niece Kelly Porter is her HCPOA  [address] [place]    DNR DNI SNF Rehab with  palliative services, patient wants to go to [facility] for 1-2 weeks, then wishes to come home with hospice services with 24/7 care as is being arranged for by her niece.  Patient has decided against surgery, she states she is spiritually at peace, she does not want to undergo surgery.  Consider PT OT and case management follow up Continue current symptom measures.   Code Status/Advance Care Planning:  DNR    Symptom Management:    continue current care.   Palliative Prophylaxis:   Bowel Regimen      Psycho-social/Spiritual:   Desire for further Chaplaincy support:yes  Additional  Recommendations: Education on Hospice  Prognosis:   < 3 months  Discharge Planning: [facility] for rehab with Palliative care service follow-up      Primary Diagnoses: Present on Admission: . Severe sepsis with septic shock ([place]) . Mass of colon . Enteritis   I have reviewed the medical record, interviewed the patient and family, and examined the patient. The following aspects are pertinent.  Past Medical History:  Diagnosis Date  . Anemia "always"  . Anxiety   . Arthritis    "back" (05/19/2015)  . Bowel obstruction   . Chronic lower back pain    "qd for the last month" (05/19/2015)  . History of blood transfusion 05/19/2015   "low HgB"  . Melanoma of face ([place])    "skin only; no other treatment"  . Pneumonia X 1   Social History   Social History  . Marital status: Widowed    Spouse name: N/A  . Number of children: N/A  . Years of education: N/A   Social History Main Topics  . Smoking status: Never Smoker  . Smokeless tobacco: Never Used  . Alcohol use No  . Drug use: No  . Sexual activity: No   Other Topics Concern  . None   Social History Narrative  . None   History reviewed. No pertinent family history. Scheduled Meds: . ampicillin-sulbactam (UNASYN) IV  1.5 g Intravenous Q12H  . Chlorhexidine Gluconate Cloth  6 each Topical Q0600  . cholecalciferol  1,000 Units Oral BID  . feeding supplement (ENSURE ENLIVE)  237 mL Oral TID BM  . ferrous sulfate  325 mg Oral TID WC  . heparin  5,000 Units Subcutaneous Q8H  . metoprolol tartrate  12.5 mg Oral BID  . multivitamin with minerals  1 tablet Oral Daily  . mupirocin ointment  1 application Nasal BID  . pantoprazole  40 mg Oral Daily  . sodium chloride flush  3 mL Intravenous Q12H  . sodium phosphate  1 enema Rectal Once  . vitamin C  500 mg Oral Daily   Continuous Infusions: PRN Meds:.acetaminophen **OR** acetaminophen, ondansetron **OR** ondansetron (ZOFRAN) IV, traMADol Medications Prior  to Admission:  Prior to Admission medications   Medication Sig Start Date End Date Taking? Authorizing Provider  acetaminophen (TYLENOL) 325 MG tablet Take 2 tablets (650 mg total) by mouth every 6 (six) hours as needed for mild pain (or Fever >/= 101). 06/17/16  Yes Dustin Flock, MD  acetaminophen (TYLENOL) 500 MG tablet Take 500 mg by mouth 2 (two) times daily.   Yes Historical Provider, MD  Cholecalciferol (VITAMIN D3) 1000 units CAPS Take 1,000 Units by mouth 2 (two) times daily.   Yes Historical Provider, MD  Cranberry 500 MG CHEW Chew 500 mg by mouth daily.   Yes Historical Provider, MD  feeding supplement, ENSURE ENLIVE, (ENSURE ENLIVE) LIQD Take 237 mLs by  mouth daily.   Yes Historical Provider, MD  ferrous sulfate 325 (65 FE) MG tablet Take 325 mg by mouth daily with breakfast.   Yes Historical Provider, MD  furosemide (LASIX) 20 MG tablet Take 20 mg by mouth daily as needed for fluid or edema.   Yes Historical Provider, MD  metoprolol tartrate (LOPRESSOR) 25 MG tablet Take 1 tablet (25 mg total) by mouth 2 (two) times daily. 06/17/16  Yes Dustin Flock, MD  Multiple Vitamin (MULTIVITAMIN WITH MINERALS) TABS tablet Take 1 tablet by mouth daily.   Yes Historical Provider, MD  ondansetron (ZOFRAN) 4 MG tablet Take 1 tablet (4 mg total) by mouth every 6 (six) hours as needed for nausea. 06/17/16  Yes Dustin Flock, MD  pantoprazole (PROTONIX) 40 MG tablet Take 1 tablet (40 mg total) by mouth daily. 05/22/15  Yes [name], DO  traMADol (ULTRAM) 50 MG tablet Take 0.5 tablets (25 mg total) by mouth every 6 (six) hours as needed for moderate pain or severe pain. 06/17/16  Yes Dustin Flock, MD  vitamin C (ASCORBIC ACID) 500 MG tablet Take 500 mg by mouth daily.   Yes Historical Provider, MD  cyclobenzaprine (FLEXERIL) 5 MG tablet Take 1 tablet (5 mg total) by mouth 3 (three) times daily as needed for muscle spasms. Patient not taking: Reported on 08/31/2016 05/22/15   [name], DO   No  Known Allergies Review of Systems + for episodic abdominal pain  Physical Exam Weak frail elderly lady hard of hearing resting in bed eating her own breakfast no acute complaints does not appear to be in any distress at the current time Lungs clear S1-S2 Abdomen soft nondistended nontender Extremities no edema Awake alert oriented hard of hearing answers all questions appropriately Mood and affect appropriate  Vital Signs: BP (!) 141/67 (BP Location: Right Arm)   Pulse 95   Temp 98.2 F (36.8 C)   Resp 15   Ht 5\' 2"  (1.575 m)   Wt 47.4 kg (104 lb 8 oz)   SpO2 95%   BMI 19.11 kg/m  Pain Assessment: No/denies pain       SpO2: SpO2: 95 % O2 Device:SpO2: 95 % O2 Flow Rate: .O2 Flow Rate (L/min): 1.5 L/min  IO: Intake/output summary:  Intake/Output Summary (Last 24 hours) at 09/04/16 1138 Last data filed at 09/04/16 1040  Gross per 24 hour  Intake              730 ml  Output              500 ml  Net              230 ml    LBM: Last BM Date: 09/01/16 Baseline Weight: Weight: 39 kg (86 lb) Most recent weight: Weight: 47.4 kg (104 lb 8 oz)     Palliative Assessment/Data:   Flowsheet Rows   Flowsheet Row Most Recent Value  Intake Tab  Referral Department  Hospitalist  Unit at Time of Referral  Med/Surg Unit  Palliative Care Primary Diagnosis  Cancer  Date Notified  09/03/16  Palliative Care Type  New Palliative care  Reason for referral  Clarify Goals of Care  Date of Admission  08/31/16  Date first seen by Palliative Care  09/04/16  # of days IP prior to Palliative referral  3  Clinical Assessment  Palliative Performance Scale Score  30%  Pain Max last 24 hours  5  Pain Min Last 24 hours  4  Dyspnea Max Last 24 Hours  4  Dyspnea Min Last 24 hours  3  Nausea Max Last 24 Hours  3  Nausea Min Last 24 Hours  2  Psychosocial & Spiritual Assessment  Palliative Care Outcomes  Patient/Family meeting held?  Yes  Who was at the meeting?  patient, niece     Palliative Care Outcomes  Clarified goals of care      Time In:  10 Time Out:  1110 Time Total:  70  Greater than 50%  of this time was spent counseling and coordinating care related to the above assessment and plan.  Signed by: Loistine Chance, MD  [phone]  Please contact Palliative Medicine Team phone at [phone] for questions and concerns.  For individual provider: See Shea Evans

## 2016-09-04 NOTE — Progress Notes (Signed)
Patient O2 sats in low 80's on room air. Placed on 1.5 Liters. She came u to 93-95. Will continue to monitor.

## 2016-09-04 NOTE — Progress Notes (Signed)
PROGRESS NOTE    Kelly Porter  HTD:[id number] DOB: [birth year] DOA: 08/31/2016 PCP: Henrine Screws, MD     Brief Narrative:  Patient is a 82 y.o. female with past medical history of chronic iron deficiency anemia due to GI loss, chronic anxiety who presented to the hospital with confusion. She was thought to have sepsis from unknown foci along with encephalopathy and admitted for further evaluation and treatment.   Assessment & Plan:   Principal Problem:   Mass of colon Active Problems:   Severe sepsis with septic shock (HCC)   Sepsis (HCC)   Bowel obstruction   AKI (acute kidney injury) ([place])   Slurred speech   Hypertension   Chronic pain   Symptomatic anemia   Enteritis  Sepsis: Likely secondary to enteritis. Sepsis pathophysiology seems to have resolved. Blood cultures are negative so far. Antibiotics deescalated to Augmentin.   Right colonic thickening, concern for colonic mass: GI following. Barium enema today showed obstructing lesion in the proximal to mid ascending colon, highly suspicious for obstructing colon carcinoma. Patient has decided to not undergo surgery or further work up at this time. Met with palliative team as well.   Normocytic anemia: Worsening hemoglobin likely secondary to acute illness and IV fluid dilution. Has history of chronic iron deficiency anemia from chronic GI loss. Iron panel, shows anemia most consistent with iron deficiency. Received feraheme and continue PO supplement TID.   Acute metabolic encephalopathy: Likely secondary to above, encephalopathy has resolved. CT head/MRI brain negative for acute abnormalities including CVA.  Acute kidney injury: Likely hemodynamically mediated, resolved with IV fluids  Hypertension: Continue lopressor   Chronic back pain: Continue as needed tramadol.   DVT prophylaxis: subq hep Code Status: DNR Family Communication: no family at bedside Disposition Plan: SNF, pending PT/OT eval. Then home  with hospice services    Consultants:   GI  Palliative Care   Procedures:   None  Antimicrobials:  Anti-infectives    Start     Dose/Rate Route Frequency Ordered Stop   09/04/16 1345  amoxicillin-clavulanate (AUGMENTIN) 875-125 MG per tablet 1 tablet     1 tablet Oral Every 12 hours 09/04/16 1338     09/02/16 1330  ampicillin-sulbactam (UNASYN) 1.5 g in sodium chloride 0.9 % 50 mL IVPB  Status:  Discontinued     1.5 g 100 mL/hr over 30 Minutes Intravenous Every 12 hours 09/02/16 1231 09/04/16 1338   09/01/16 1400  vancomycin (VANCOCIN) 500 mg in sodium chloride 0.9 % 100 mL IVPB  Status:  Discontinued     500 mg 100 mL/hr over 60 Minutes Intravenous Every 24 hours 08/31/16 1258 09/02/16 1121   08/31/16 2120  piperacillin-tazobactam (ZOSYN) IVPB 2.25 g  Status:  Discontinued     2.25 g 100 mL/hr over 30 Minutes Intravenous Every 8 hours 08/31/16 2001 09/02/16 1121   08/31/16 2100  piperacillin-tazobactam (ZOSYN) IVPB 2.25 g  Status:  Discontinued     2.25 g 100 mL/hr over 30 Minutes Intravenous Every 6 hours 08/31/16 1258 08/31/16 2001   08/31/16 1245  vancomycin (VANCOCIN) IVPB 750 mg/150 ml premix     750 mg 150 mL/hr over 60 Minutes Intravenous  Once 08/31/16 1241 08/31/16 1450   08/31/16 1230  piperacillin-tazobactam (ZOSYN) IVPB 3.375 g     3.375 g 100 mL/hr over 30 Minutes Intravenous  Once 08/31/16 1223 08/31/16 1357   08/31/16 1230  vancomycin (VANCOCIN) IVPB 1000 mg/200 mL premix  Status:  Discontinued     1,000  mg 200 mL/hr over 60 Minutes Intravenous  Once 08/31/16 1223 08/31/16 1837       Subjective: Doing well today. No complaints. We discussed her possible colon cancer, surgery for palliation, and patient has decided against any further procedures at this time. She states that she is at peace and would like to go to SNF then home with hospice  Objective: Vitals:   09/03/16 1530 09/03/16 2145 09/04/16 0537 09/04/16 1130  BP: 131/68 131/66 (!) 141/67 136/70   Pulse: 90 87 95 83  Resp: _0 Temp: 98.1 F (36.7 C) 98.5 F (36.9 C) 98.2 F (36.8 C)   TempSrc: Oral Oral    SpO2: 93% 91% 95% 93%  Weight:      Height:        Intake/Output Summary (Last 24 hours) at 09/04/16 1343 Last data filed at 09/04/16 1040  Gross per 24 hour  Intake              610 ml  Output              350 ml  Net              260 ml   Filed Weights   08/31/16 1235 09/02/16 1701  Weight: 39 kg (86 lb) 47.4 kg (104 lb 8 oz)    Examination:  General exam: Appears calm and comfortable, thin elderly female  Respiratory system: Clear to auscultation. Respiratory effort normal. Cardiovascular system: S1 & S2 heard, RRR. No JVD, murmurs, rubs, gallops or clicks. No pedal edema. Gastrointestinal system: Abdomen is nondistended, soft and nontender to palpation. No organomegaly or masses felt. Normal bowel sounds heard. Central nervous system: Alert and oriented. No focal neurological deficits. Extremities: Symmetric 5 x 5 power. Skin: No rashes, lesions or ulcers Psychiatry: Judgement and insight appear normal. Mood & affect appropriate.   Data Reviewed: I have personally reviewed following labs and imaging studies  CBC:  Recent Labs Lab 08/31/16 1148 08/31/16 1159 09/01/16 0509 09/02/16 0621 09/03/16 0801  WBC 5.8  --  7.3 6.3 7.8  NEUTROABS 5.1  --   --   --   --   HGB 9.7* 10.9* 7.1* 7.4* 7.8*  HCT 31.3* 32.0* 23.5* 24.5* 25.8*  MCV 87.7  --  88.3 88.8 89.0  PLT 305  --  263 275 947   Basic Metabolic Panel:  Recent Labs Lab 08/31/16 1148 08/31/16 1159 08/31/16 1723 09/01/16 0509 09/02/16 0415 09/03/16 0801  NA 135 136  --  142 140 138  K 4.6 4.4 4.9 4.3 3.9 3.7  CL 100* 101  --  110 111 107  CO2 21*  --   --  _1 GLUCOSE 163* 162*  --  124* 110* 114*  BUN 31* 30*  --  33* 26* 16  CREATININE 1.25* 1.00  --  1.11* 0.91 0.75  CALCIUM 8.5*  --   --  7.0* 7.5* 8.1*   GFR: Estimated Creatinine Clearance: 31.5 mL/min (by C-G  formula based on SCr of 0.75 mg/dL). Liver Function Tests:  Recent Labs Lab 08/31/16 1148 09/01/16 0509  AST 38 19  ALT 11* 10*  ALKPHOS 69 49  BILITOT 0.7 0.5  PROT 5.4* 4.2*  ALBUMIN 3.2* 2.3*   No results for input(s): LIPASE, AMYLASE in the last 168 hours. No results for input(s): AMMONIA in the last 168 hours. Coagulation Profile:  Recent Labs Lab 08/31/16 1148  INR 1.09   Cardiac Enzymes:  No results for input(s): CKTOTAL, CKMB, CKMBINDEX, TROPONINI in the last 168 hours. BNP (last 3 results) No results for input(s): PROBNP in the last 8760 hours. HbA1C: No results for input(s): HGBA1C in the last 72 hours. CBG:  Recent Labs Lab 09/03/16 0800  GLUCAP 108*   Lipid Profile: No results for input(s): CHOL, HDL, LDLCALC, TRIG, CHOLHDL, LDLDIRECT in the last 72 hours. Thyroid Function Tests: No results for input(s): TSH, T4TOTAL, FREET4, T3FREE, THYROIDAB in the last 72 hours. Anemia Panel: No results for input(s): VITAMINB12, FOLATE, FERRITIN, TIBC, IRON, RETICCTPCT in the last 72 hours. Sepsis Labs:  Recent Labs Lab 08/31/16 1453 08/31/16 1501 08/31/16 1723 08/31/16 2115 09/01/16 0509 09/02/16 0415 09/04/16 0520  PROCALCITON 22.31  --   --   --   --  11.55 2.93  LATICACIDVEN  --  3.14* 3.1* 2.6* 1.1  --   --     Recent Results (from the past 240 hour(s))  Blood culture (routine x 2)     Status: None (Preliminary result)   Collection Time: 08/31/16 12:20 PM  Result Value Ref Range Status   Specimen Description BLOOD RIGHT UPPER ARM  Final   Special Requests BOTTLES DRAWN AEROBIC AND ANAEROBIC 5CC  Final   Culture NO GROWTH 4 DAYS  Final   Report Status PENDING  Incomplete  Blood culture (routine x 2)     Status: None (Preliminary result)   Collection Time: 08/31/16  2:53 PM  Result Value Ref Range Status   Specimen Description BLOOD RIGHT HAND  Final   Special Requests IN PEDIATRIC BOTTLE 2CC  Final   Culture NO GROWTH 4 DAYS  Final   Report  Status PENDING  Incomplete  MRSA PCR Screening     Status: Abnormal   Collection Time: 08/31/16  5:09 PM  Result Value Ref Range Status   MRSA by PCR POSITIVE (A) NEGATIVE Final    Comment:        The GeneXpert MRSA Assay (FDA approved for NASAL specimens only), is one component of a comprehensive MRSA colonization surveillance program. It is not intended to diagnose MRSA infection nor to guide or monitor treatment for MRSA infections. RESULT CALLED TO, READ BACK BY AND VERIFIED WITH: M.YORK,RN AT 2149 BY L.PITT 08/31/16        Radiology Studies: Dg Colon W/cm - Wo/w Kub  Result Date: 09/03/2016 CLINICAL DATA:  Anemia. Distal small bowel obstruction. Right colonic mass suspected on recent CT. EXAM: SINGLE COLUMN BARIUM ENEMA TECHNIQUE: Initial scout AP supine abdominal image obtained to insure adequate colon cleansing. Barium was introduced into the colon in a retrograde fashion and refluxed from the rectum to the cecum. Spot images of the colon followed by overhead radiographs were obtained. FLUOROSCOPY TIME:  Fluoroscopy Time: 3 minutes 55 seconds ; low dose pulsed fluoroscopy Number of Acquired Spot Images: 0 COMPARISON:  CT on 08/31/2016 FINDINGS: Scout radiograph shows mild dilatation of distal small bowel loops, suspicious for partial distal small bowel obstruction as demonstrated on recent CT. Single column barium enema was performed. Severe diverticulosis is seen predominately involving the descending and sigmoid colon. No evidence of diverticulitis. Complete colonic obstruction is seen involving the proximal to mid ascending colon. No opacification of the cecum is seen. This has an abrupt masslike margin, and corresponds with the site of suspected mass on recent CT. This is highly suspicious for obstructing colon carcinoma. No other colonic masses or strictures identified. IMPRESSION: Obstructing lesion in the proximal to mid ascending colon, highly  suspicious for obstructing colon  carcinoma. No other colonic masses or strictures identified. Severe diverticulosis involving the descending and sigmoid colon. These results were discussed by telephone at the time of interpretation on 09/03/2016 at 10:31 am with Dr. Cristina Gong, who verbally acknowledged these results. Electronically Signed   By: Earle Gell M.D.   On: 09/03/2016 10:33      Scheduled Meds: . amoxicillin-clavulanate  1 tablet Oral Q12H  . Chlorhexidine Gluconate Cloth  6 each Topical Q0600  . cholecalciferol  1,000 Units Oral BID  . feeding supplement (ENSURE ENLIVE)  237 mL Oral TID BM  . ferrous sulfate  325 mg Oral TID WC  . heparin  5,000 Units Subcutaneous Q8H  . metoprolol tartrate  12.5 mg Oral BID  . multivitamin with minerals  1 tablet Oral Daily  . mupirocin ointment  1 application Nasal BID  . pantoprazole  40 mg Oral Daily  . sodium chloride flush  3 mL Intravenous Q12H  . sodium phosphate  1 enema Rectal Once  . vitamin C  500 mg Oral Daily   Continuous Infusions:    LOS: 4 days    Time spent: 30 minutes   Dessa Phi, DO [facility] [email or web address] Password [email or web address] 09/04/2016, 1:43 PM

## 2016-09-04 NOTE — Evaluation (Signed)
Physical Therapy Evaluation Patient Details Name: Kelly Porter MRN: [id number] DOB: 03/12/1919 Today's Date: 09/04/2016   History of Present Illness  Pt is a 82 y.o. female with PMH including small hiatal hernia, anxiety, PNA, chronic LBP, and anemia.  Pt admitted with septic shock with newly diagnosed colon CA  Clinical Impression  Pt pleasant and wanting to be able to mobilize and return home. Pt with confusion regarding whether she came from home or SNF but per chart she was home immediately PTA. Pt with decreased strength, balance, assist required for transfers and gait who will require higher level of care than what family is currently able to provide. Pt will benefit from acute therapy to address above and below deficits to maximize function and decrease burden of care. Pt in bed on arrival with oxygen off with sats 81% on RA able to increase to 95% with 2L O2. Pt also noted dizziness with standing, no noted nystagmus and BP 148/68 in standing with pt report of dizziness as new onset. RN aware of all above    Follow Up Recommendations Supervision/Assistance - 24 hour;SNF    Equipment Recommendations  Rolling walker with 5" wheels    Recommendations for Other Services OT consult     Precautions / Restrictions Precautions Precautions: Fall Precaution Comments: watch sats Restrictions Weight Bearing Restrictions: No      Mobility  Bed Mobility Overal bed mobility: Needs Assistance Bed Mobility: Supine to Sit     Supine to sit: Min guard     General bed mobility comments: assist to manage lines with increased time and cues  Transfers Overall transfer level: Needs assistance   Transfers: Sit to/from Stand;Stand Pivot Transfers Sit to Stand: Mod assist Stand pivot transfers: Mod assist       General transfer comment: pt stood from bed and BSC x 5 trials with pt reporting dizziness but not orthostatic with assist to shift weight anteriorly and control pelvis to pivot  bed <>BSC  Ambulation/Gait Ambulation/Gait assistance: Mod assist Ambulation Distance (Feet): 15 Feet Assistive device: 1 person hand held assist Gait Pattern/deviations: Trunk flexed;Shuffle;Narrow base of support   Gait velocity interpretation: Below normal speed for age/gender General Gait Details: pt stated she feels unsteady in standing with HHA and reaching out for environmental supports. Pt with flexed trunk, posterior right lean and assist for balance, anterior translation and safety with cues throughout for posture and position in space  Stairs            Wheelchair Mobility    Modified Rankin (Stroke Patients Only)       Balance Overall balance assessment: Needs assistance   Sitting balance-Leahy Scale: Good       Standing balance-Leahy Scale: Poor                               Pertinent Vitals/Pain Pain Assessment: No/denies pain    Home Living Family/patient expects to be discharged to:: Private residence Living Arrangements: Alone Available Help at Discharge: Family;Available PRN/intermittently Type of Home: House Home Access: Stairs to enter Entrance Stairs-Rails: Psychiatric nurse of Steps: 2 Home Layout: One level Home Equipment: Grab bars - toilet;Grab bars - tub/shower      Prior Function Level of Independence: Independent         Comments: pt states she was moving around and caring for herself. Siblings live nearby and check in on her daily     Hand Dominance  Extremity/Trunk Assessment   Upper Extremity Assessment: Generalized weakness           Lower Extremity Assessment: Generalized weakness      Cervical / Trunk Assessment: Kyphotic  Communication   Communication: HOH  Cognition Arousal/Alertness: Awake/alert Behavior During Therapy: WFL for tasks assessed/performed Overall Cognitive Status: No family/caregiver present to determine baseline cognitive functioning       Memory:  Decreased short-term memory              General Comments      Exercises     Assessment/Plan    PT Assessment Patient needs continued PT services  PT Problem List Decreased strength;Decreased mobility;Decreased activity tolerance;Decreased balance;Decreased knowledge of use of DME          PT Treatment Interventions Gait training;Therapeutic exercise;Patient/family education;Balance training;Functional mobility training;Neuromuscular re-education;DME instruction;Therapeutic activities    PT Goals (Current goals can be found in the Care Plan section)  Acute Rehab PT Goals Patient Stated Goal: be able to walk  PT Goal Formulation: With patient Time For Goal Achievement: 09/18/16 Potential to Achieve Goals: Fair    Frequency Min 3X/week   Barriers to discharge Decreased caregiver support      Co-evaluation               End of Session Equipment Utilized During Treatment: Gait belt;Oxygen Activity Tolerance: Patient tolerated treatment well Patient left: in chair;with call bell/phone within reach;with chair alarm set Nurse Communication: Mobility status         Time: [id number] PT Time Calculation (min) (ACUTE ONLY): 28 min   Charges:   PT Evaluation $PT Eval Moderate Complexity: 1 Procedure PT Treatments $Therapeutic Activity: 8-22 mins   PT G Codes:        Waver Dibiasio B [place] Oct 01, 2016, 1:58 PM Elwyn Reach, [place]

## 2016-09-05 LAB — BASIC METABOLIC PANEL
ANION GAP: 7 (ref 5–15)
BUN: 10 mg/dL (ref 6–20)
CALCIUM: 8.3 mg/dL — AB (ref 8.9–10.3)
CO2: 28 mmol/L (ref 22–32)
Chloride: 100 mmol/L — ABNORMAL LOW (ref 101–111)
Creatinine, Ser: 0.78 mg/dL (ref 0.44–1.00)
Glucose, Bld: 115 mg/dL — ABNORMAL HIGH (ref 65–99)
Potassium: 4.4 mmol/L (ref 3.5–5.1)
SODIUM: 135 mmol/L (ref 135–145)

## 2016-09-05 LAB — MAGNESIUM: MAGNESIUM: 1.8 mg/dL (ref 1.7–2.4)

## 2016-09-05 LAB — CULTURE, BLOOD (ROUTINE X 2)
CULTURE: NO GROWTH
Culture: NO GROWTH

## 2016-09-05 LAB — PHOSPHORUS: PHOSPHORUS: 2.4 mg/dL — AB (ref 2.5–4.6)

## 2016-09-05 MED ORDER — POLYETHYLENE GLYCOL 3350 17 G PO PACK: 17.0000 g | PACK | Freq: Every day | ORAL | Status: DC

## 2016-09-05 MED ORDER — DOCUSATE SODIUM 100 MG PO CAPS: 100.0000 mg | ORAL_CAPSULE | Freq: Two times a day (BID) | ORAL | Status: DC

## 2016-09-05 MED ORDER — TRAMADOL HCL 50 MG PO TABS: 25.0000 mg | ORAL_TABLET | Freq: Four times a day (QID) | ORAL | 0 refills | Status: AC | PRN

## 2016-09-05 MED ORDER — SODIUM CHLORIDE 0.9 % IV SOLN
510.0000 mg | Freq: Once | INTRAVENOUS | Status: DC
  Filled 2016-09-05: qty 17

## 2016-09-05 NOTE — Clinical Social Work Placement (Signed)
   CLINICAL SOCIAL WORK PLACEMENT  NOTE  Date:  09/05/2016  Patient Details  Name: Kelly Porter MRN: [id number] Date of Birth: [birth year]  Clinical Social Work is seeking post-discharge placement for this patient at the [place] level of care (*CSW will initial, date and re-position this form in  chart as items are completed):  Yes   Patient/family provided with [facility] Work Department's list of facilities offering this level of care within the geographic area requested by the patient (or if unable, by the patient's family).  Yes   Patient/family informed of their freedom to choose among providers that offer the needed level of care, that participate in Medicare, Medicaid or managed care program needed by the patient, have an available bed and are willing to accept the patient.  Yes   Patient/family informed of [facility]'s ownership interest in [facility] and [facility], as well as of the fact that they are under no obligation to receive care at these facilities.  PASRR submitted to EDS on       PASRR number received on       Existing PASRR number confirmed on 09/03/16     FL2 transmitted to all facilities in geographic area requested by pt/family on 09/03/16     FL2 transmitted to all facilities within larger geographic area on       Patient informed that his/her managed care company has contracts with or will negotiate with certain facilities, including the following:        Yes   Patient/family informed of bed offers received.  Patient chooses bed at [place], [place]     Physician recommends and patient chooses bed at      Patient to be transferred to [place] on 09/05/16.  Patient to be transferred to facility by Ambulance     Patient family notified on 09/05/16 of transfer.  Name of family member notified:  Barron Schmid    PHYSICIAN Please sign FL2, Please sign DNR, Please prepare priority discharge  summary, including medications, Please prepare prescriptions     Additional Comment:    _______________________________________________ Rigoberto Noel, LCSW 09/05/2016, 12:41 PM

## 2016-09-05 NOTE — Discharge Summary (Signed)
Physician Discharge Summary  Kelly Porter WYO:[id number] DOB: [birth year] DOA: 08/31/2016  PCP: Henrine Screws, MD  Admit date: 08/31/2016 Discharge date: 09/05/2016  Admitted From: Home Disposition:  SNF   Recommendations for Outpatient Follow-up:  1. Follow up with Palliative care upon discharge to home to set up with home hospice.   Home Health: No  Equipment/Devices: None    Discharge Condition: Stable, with poor prognosis CODE STATUS: DNR  Diet recommendation: general   Brief/Interim Summary: Kelly Porter is a 82 year old female who presented to the hospital due to altered mental status. Patient was apparently seen confused, sitting on a couch, covered and emesis and fecal matter. She lives at home alone and performs most of her ADLs. CT in the ER showed dilated, fluid-filled small bowel loops, suggesting SBO or enteritis as well as some thickening, narrowing in the proximal colon, raising question of possible mass. GI was consulted. Patient was initially treated with Unasyn for sepsis secondary to enteritis. Her acute encephalopathy continued to improve. CT head as well as MRI brain were negative for acute abnormalities including CVA. Patient underwent barium enema study, which showed probable obstruction, mass, and proximal colon. Antibiotic was the deescalated to Augmentin. Case was discussed with patient, as well as her niece, regarding surgical intervention for palliative therapy versus palliative care only. Patient has decided against any surgical intervention. She did also meet with palliative care. She will be discharged to skilled nursing facility to build up her strength, and be discharged home with home hospice.  Subjective on day of discharge: Her main complaint today is generalized weakness. She has no other complaints including chest pain, shortness of breath, nausea, vomiting, abdominal pain. She did have a bowel movement this morning.  Discharge Diagnoses:   Principal Problem:   Mass of colon Active Problems:   Severe sepsis with septic shock (HCC)   Sepsis (HCC)   Bowel obstruction   AKI (acute kidney injury) ([place])   Slurred speech   Hypertension   Chronic pain   Symptomatic anemia   Enteritis  Sepsis:Likely secondary to enteritis. Sepsis pathophysiology seems to have resolved. Blood cultures are negative so far. Antibiotics deescalated to Augmentin for total 5 day course. No antibiotics on discharge.    Right colonic thickening, concern for colonic mass:GI following. Barium enema today showed obstructing lesion in the proximal to mid ascending colon, highly suspicious for obstructing colon carcinoma. Patient has decided to not undergo surgery or further work up at this time. Met with palliative team as well and has decided to meet with home hospice.   Normocytic anemia:Worsening hemoglobin likely secondary to acute illness and IV fluid dilution. Has history of chronic iron deficiency anemia fromchronic GI loss. Iron panel, shows anemia most consistent with iron deficiency. Received feraheme and continue PO supplement.   Acute metabolic encephalopathy:Likely secondary to above, encephalopathy has resolved. CT head/MRI brain negative for acute abnormalities including CVA.  Acute kidney injury:Likely hemodynamically mediated, resolved with IV fluids  Hypertension:Continue lopressor   Chronic back pain:Continue as needed tramadol.  Discharge Instructions  Discharge Instructions    Diet general    Complete by:  As directed    Increase activity slowly    Complete by:  As directed        Medication List    STOP taking these medications   cyclobenzaprine 5 MG tablet Commonly known as:  FLEXERIL   furosemide 20 MG tablet Commonly known as:  LASIX     TAKE these medications  acetaminophen 500 MG tablet Commonly known as:  TYLENOL Take 500 mg by mouth 2 (two) times daily.   acetaminophen 325 MG  tablet Commonly known as:  TYLENOL Take 2 tablets (650 mg total) by mouth every 6 (six) hours as needed for mild pain (or Fever >/= 101).   Cranberry 500 MG Chew Chew 500 mg by mouth daily.   feeding supplement (ENSURE ENLIVE) Liqd Take 237 mLs by mouth daily.   ferrous sulfate 325 (65 FE) MG tablet Take 325 mg by mouth daily with breakfast.   metoprolol tartrate 25 MG tablet Commonly known as:  LOPRESSOR Take 1 tablet (25 mg total) by mouth 2 (two) times daily.   multivitamin with minerals Tabs tablet Take 1 tablet by mouth daily.   ondansetron 4 MG tablet Commonly known as:  ZOFRAN Take 1 tablet (4 mg total) by mouth every 6 (six) hours as needed for nausea.   pantoprazole 40 MG tablet Commonly known as:  PROTONIX Take 1 tablet (40 mg total) by mouth daily.   traMADol 50 MG tablet Commonly known as:  ULTRAM Take 0.5 tablets (25 mg total) by mouth every 6 (six) hours as needed for moderate pain or severe pain.   vitamin C 500 MG tablet Commonly known as:  ASCORBIC ACID Take 500 mg by mouth daily.   Vitamin D3 1000 units Caps Take 1,000 Units by mouth 2 (two) times daily.      Follow-up Information    Palliative Care Follow up.   Why:  Will need to get set up with home hospice once discharged from SNF          No Known Allergies  Consultations:  GI   Palliative Care   Procedures/Studies: Dg Chest 1 View  Result Date: 08/31/2016 CLINICAL DATA:  Chest pain EXAM: CHEST 1 VIEW COMPARISON:  07/13/2012 FINDINGS: Heart is normal size. Mediastinal contours are within normal limits. Calcifications within the aortic arch. Mild interstitial prominence throughout the lungs, new since prior study. No effusions or acute bony abnormality. IMPRESSION: New mild interstitial prominence throughout the lungs which could reflect chronic interstitial lung disease. Electronically Signed   By: Rolm Baptise M.D.   On: 08/31/2016 13:03   Ct Head Wo Contrast  Result Date:  08/31/2016 CLINICAL DATA:  Altered mental status EXAM: CT HEAD WITHOUT CONTRAST TECHNIQUE: Contiguous axial images were obtained from the base of the skull through the vertex without intravenous contrast. COMPARISON:  05/19/2015 FINDINGS: Brain: There is atrophy and chronic small vessel disease changes. No acute intracranial abnormality. Specifically, no hemorrhage, hydrocephalus, mass lesion, acute infarction, or significant intracranial injury. Vascular: No hyperdense vessel or unexpected calcification. Skull: No acute calvarial abnormality. Sinuses/Orbits: Visualized paranasal sinuses and mastoids clear. Orbital soft tissues unremarkable. Other: None IMPRESSION: No acute intracranial abnormality. Atrophy, chronic microvascular disease. Electronically Signed   By: Rolm Baptise M.D.   On: 08/31/2016 13:06   Mr Brain Wo Contrast  Result Date: 09/01/2016 CLINICAL DATA:  Slurred speech. EXAM: MRI HEAD WITHOUT CONTRAST TECHNIQUE: Multiplanar, multiecho pulse sequences of the brain and surrounding structures were obtained without intravenous contrast. COMPARISON:  CT head 08/31/2016 FINDINGS: Brain: Advanced cerebral atrophy. Ventricular enlargement consistent with atrophy. Negative for acute infarct. Mild chronic microvascular ischemic change. Negative for hemorrhage Right frontal parafalcine extra-axial mass lesion compatible with meningioma. This measures 8 x 15 mm. No edema in the adjacent brain. Normal pituitary. Vascular: Normal arterial flow void. Skull and upper cervical spine: Negative Sinuses/Orbits: Negative Other: None IMPRESSION: No acute  infarct.  Mild chronic microvascular ischemic change Advanced atrophy Right frontal parafalcine meningioma measuring 8 x 15 mm. Electronically Signed   By: Franchot Gallo M.D.   On: 09/01/2016 10:02   Ct Abdomen Pelvis W Contrast  Result Date: 08/31/2016 CLINICAL DATA:  82 year old with nausea, vomiting and pain. EXAM: CT ABDOMEN AND PELVIS WITH CONTRAST  TECHNIQUE: Multidetector CT imaging of the abdomen and pelvis was performed using the standard protocol following bolus administration of intravenous contrast. CONTRAST:  60m ISOVUE-300 IOPAMIDOL (ISOVUE-300) INJECTION 61% COMPARISON:  06/13/2016 FINDINGS: Lower chest: Mild dependent atelectasis at the lung bases. No large pleural effusions. Small-moderate sized hiatal hernia. Right pleural effusion has resolved from the previous examination. Hepatobiliary: Small amount of perihepatic ascites. Otherwise, normal appearance of the liver, gallbladder and portal venous system is patent. No significant biliary dilatation. Pancreas: Normal appearance of the pancreas without inflammation or duct dilatation. Spleen: Normal appearance of spleen without enlargement. Adrenals/Urinary Tract: Normal adrenal glands. Again noted is an extrarenal pelvis in both kidneys. Tiny cyst in the right kidney lower pole. There is no significant hydronephrosis. Limited evaluation of the urinary bladder due to artifact from the left hip arthroplasty. There is fluid in the urinary bladder. Probable small cyst in left kidney. Stomach/Bowel: Small to moderate sized hiatal hernia. Multiple diverticula involving the sigmoid colon. Dilated and fluid-filled loops of small bowel with wall enhancement. There are fluid-filled and mildly dilated loops of colon. The appendix is a mildly distended and fluid-filled, best seen on sequence 2, image 61. Appendix roughly measures 0.8 cm in diameter. No definite inflammatory changes around the appendix. Limited evaluation of the ileocecal valve. There is concern for mild focal narrowing of the right colon on sequence 2, image 50. Vascular/Lymphatic: Atherosclerotic calcifications in aorta without aneurysm. No significant lymph node enlargement in the abdomen or pelvis. Reproductive: Uterus is not confidently identified but this area is poorly characterized due to artifact from the left hip replacement. Other:  Small amount of free fluid in the pelvis and right lower quadrant. Small amount of fluid around the liver. Musculoskeletal: Left hip arthroplasty is located. Disc space narrowing along the anterior aspect of L1-L2. IMPRESSION: Again noted are dilating fluid-filled loops of distal small bowel and colon. A transition point is not clearly identified and findings may be associated with an ileus. Again noted is a small amount of abdominal and pelvic ascites. There is wall enhancement of the distal small bowel and enteritis cannot be excluded. Focal narrowing and wall thickening in the right colon. This colonic narrowing is more prominent than the previous examination and this could represent focal peristalsis. Colonic lesion in this area cannot be excluded. Hiatal hernia. Electronically Signed   By: AMarkus DaftM.D.   On: 08/31/2016 13:25   Dg Abd 2 Views  Result Date: 09/01/2016 CLINICAL DATA:  Abdominal pain and constipation EXAM: ABDOMEN - 2 VIEW COMPARISON:  CT abdomen and pelvis August 31, 2016 FINDINGS: Supine and left lateral decubitus images obtained. There is mild stool in the colon. There is no appreciable bowel dilatation or air-fluid level to suggest bowel obstruction. No free air. There is a total hip prosthesis on the left. There is evidence of old healed fracture of the right posterior eleventh rib. IMPRESSION: Bowel gas pattern unremarkable without evident bowel obstruction or free air. Electronically Signed   By: WLowella GripIII M.D.   On: 09/01/2016 08:38   Dg Colon W/cm - Wo/w Kub  Result Date: 09/03/2016 CLINICAL DATA:  Anemia. Distal  small bowel obstruction. Right colonic mass suspected on recent CT. EXAM: SINGLE COLUMN BARIUM ENEMA TECHNIQUE: Initial scout AP supine abdominal image obtained to insure adequate colon cleansing. Barium was introduced into the colon in a retrograde fashion and refluxed from the rectum to the cecum. Spot images of the colon followed by overhead  radiographs were obtained. FLUOROSCOPY TIME:  Fluoroscopy Time: 3 minutes 55 seconds ; low dose pulsed fluoroscopy Number of Acquired Spot Images: 0 COMPARISON:  CT on 08/31/2016 FINDINGS: Scout radiograph shows mild dilatation of distal small bowel loops, suspicious for partial distal small bowel obstruction as demonstrated on recent CT. Single column barium enema was performed. Severe diverticulosis is seen predominately involving the descending and sigmoid colon. No evidence of diverticulitis. Complete colonic obstruction is seen involving the proximal to mid ascending colon. No opacification of the cecum is seen. This has an abrupt masslike margin, and corresponds with the site of suspected mass on recent CT. This is highly suspicious for obstructing colon carcinoma. No other colonic masses or strictures identified. IMPRESSION: Obstructing lesion in the proximal to mid ascending colon, highly suspicious for obstructing colon carcinoma. No other colonic masses or strictures identified. Severe diverticulosis involving the descending and sigmoid colon. These results were discussed by telephone at the time of interpretation on 09/03/2016 at 10:31 am with Dr. Cristina Gong, who verbally acknowledged these results. Electronically Signed   By: Earle Gell M.D.   On: 09/03/2016 10:33       Discharge Exam: Vitals:   09/04/16 2109 09/05/16 0446  BP: 136/68 135/90  Pulse: 89 99  Resp: 16 20  Temp: 98.9 F (37.2 C) 98.2 F (36.8 C)   Vitals:   09/04/16 1130 09/04/16 1347 09/04/16 2109 09/05/16 0446  BP: 136/70 (!) 148/68 136/68 135/90  Pulse: 83  89 99  Resp:   16 20  Temp:   98.9 F (37.2 C) 98.2 F (36.8 C)  TempSrc:   Oral Oral  SpO2: 93% 95% 97% 95%  Weight:    46.4 kg (102 lb 4.7 oz)  Height:        General: Pt is alert, awake, not in acute distress Cardiovascular: RRR, S1/S2 +, no rubs, no gallops Respiratory: CTA bilaterally, no wheezing, no rhonchi Abdominal: Soft, NT, ND, bowel sounds  + Extremities: no edema, no cyanosis    The results of significant diagnostics from this hospitalization (including imaging, microbiology, ancillary and laboratory) are listed below for reference.     Microbiology: Recent Results (from the past 240 hour(s))  Blood culture (routine x 2)     Status: None (Preliminary result)   Collection Time: 08/31/16 12:20 PM  Result Value Ref Range Status   Specimen Description BLOOD RIGHT UPPER ARM  Final   Special Requests BOTTLES DRAWN AEROBIC AND ANAEROBIC 5CC  Final   Culture NO GROWTH 4 DAYS  Final   Report Status PENDING  Incomplete  Blood culture (routine x 2)     Status: None (Preliminary result)   Collection Time: 08/31/16  2:53 PM  Result Value Ref Range Status   Specimen Description BLOOD RIGHT HAND  Final   Special Requests IN PEDIATRIC BOTTLE 2CC  Final   Culture NO GROWTH 4 DAYS  Final   Report Status PENDING  Incomplete  MRSA PCR Screening     Status: Abnormal   Collection Time: 08/31/16  5:09 PM  Result Value Ref Range Status   MRSA by PCR POSITIVE (A) NEGATIVE Final    Comment:  The GeneXpert MRSA Assay (FDA approved for NASAL specimens only), is one component of a comprehensive MRSA colonization surveillance program. It is not intended to diagnose MRSA infection nor to guide or monitor treatment for MRSA infections. RESULT CALLED TO, READ BACK BY AND VERIFIED WITH: M.YORK,RN AT 2149 BY L.PITT 08/31/16      Labs: BNP (last 3 results) No results for input(s): BNP in the last 8760 hours. Basic Metabolic Panel:  Recent Labs Lab 08/31/16 1148 08/31/16 1159 08/31/16 1723 09/01/16 0509 09/02/16 0415 09/03/16 0801 09/05/16 0813  NA 135 136  --  142 140 138 135  K 4.6 4.4 4.9 4.3 3.9 3.7 4.4  CL 100* 101  --  110 111 107 100*  CO2 21*  --   --  _0 GLUCOSE 163* 162*  --  124* 110* 114* 115*  BUN 31* 30*  --  33* 26* 16 10  CREATININE 1.25* 1.00  --  1.11* 0.91 0.75 0.78  CALCIUM 8.5*  --   --   7.0* 7.5* 8.1* 8.3*  MG  --   --   --   --   --   --  1.8  PHOS  --   --   --   --   --   --  2.4*   Liver Function Tests:  Recent Labs Lab 08/31/16 1148 09/01/16 0509  AST 38 19  ALT 11* 10*  ALKPHOS 69 49  BILITOT 0.7 0.5  PROT 5.4* 4.2*  ALBUMIN 3.2* 2.3*   No results for input(s): LIPASE, AMYLASE in the last 168 hours. No results for input(s): AMMONIA in the last 168 hours. CBC:  Recent Labs Lab 08/31/16 1148 08/31/16 1159 09/01/16 0509 09/02/16 0621 09/03/16 0801  WBC 5.8  --  7.3 6.3 7.8  NEUTROABS 5.1  --   --   --   --   HGB 9.7* 10.9* 7.1* 7.4* 7.8*  HCT 31.3* 32.0* 23.5* 24.5* 25.8*  MCV 87.7  --  88.3 88.8 89.0  PLT 305  --  263 275 220   Cardiac Enzymes: No results for input(s): CKTOTAL, CKMB, CKMBINDEX, TROPONINI in the last 168 hours. BNP: Invalid input(s): POCBNP CBG:  Recent Labs Lab 09/03/16 0800  GLUCAP 108*   D-Dimer No results for input(s): DDIMER in the last 72 hours. Hgb A1c No results for input(s): HGBA1C in the last 72 hours. Lipid Profile No results for input(s): CHOL, HDL, LDLCALC, TRIG, CHOLHDL, LDLDIRECT in the last 72 hours. Thyroid function studies No results for input(s): TSH, T4TOTAL, T3FREE, THYROIDAB in the last 72 hours.  Invalid input(s): FREET3 Anemia work up No results for input(s): VITAMINB12, FOLATE, FERRITIN, TIBC, IRON, RETICCTPCT in the last 72 hours. Urinalysis    Component Value Date/Time   COLORURINE AMBER (A) 08/31/2016 1549   APPEARANCEUR CLEAR 08/31/2016 1549   LABSPEC 1.042 (H) 08/31/2016 1549   PHURINE 5.5 08/31/2016 1549   GLUCOSEU NEGATIVE 08/31/2016 1549   HGBUR NEGATIVE 08/31/2016 1549   BILIRUBINUR NEGATIVE 08/31/2016 1549   KETONESUR NEGATIVE 08/31/2016 1549   PROTEINUR NEGATIVE 08/31/2016 1549   UROBILINOGEN 0.2 05/19/2015 1856   NITRITE NEGATIVE 08/31/2016 1549   LEUKOCYTESUR NEGATIVE 08/31/2016 1549   Sepsis Labs Invalid input(s): PROCALCITONIN,  WBC,   LACTICIDVEN Microbiology Recent Results (from the past 240 hour(s))  Blood culture (routine x 2)     Status: None (Preliminary result)   Collection Time: 08/31/16 12:20 PM  Result Value Ref Range Status   Specimen Description  BLOOD RIGHT UPPER ARM  Final   Special Requests BOTTLES DRAWN AEROBIC AND ANAEROBIC 5CC  Final   Culture NO GROWTH 4 DAYS  Final   Report Status PENDING  Incomplete  Blood culture (routine x 2)     Status: None (Preliminary result)   Collection Time: 08/31/16  2:53 PM  Result Value Ref Range Status   Specimen Description BLOOD RIGHT HAND  Final   Special Requests IN PEDIATRIC BOTTLE 2CC  Final   Culture NO GROWTH 4 DAYS  Final   Report Status PENDING  Incomplete  MRSA PCR Screening     Status: Abnormal   Collection Time: 08/31/16  5:09 PM  Result Value Ref Range Status   MRSA by PCR POSITIVE (A) NEGATIVE Final    Comment:        The GeneXpert MRSA Assay (FDA approved for NASAL specimens only), is one component of a comprehensive MRSA colonization surveillance program. It is not intended to diagnose MRSA infection nor to guide or monitor treatment for MRSA infections. RESULT CALLED TO, READ BACK BY AND VERIFIED WITH: M.YORK,RN AT 2149 BY L.PITT 08/31/16      Time coordinating discharge: Over 30 minutes  SIGNED:  Dessa Phi, DO [facility] Pager [phone]  If 7PM-7AM, please contact night-coverage [email or web address] Password [email or web address] 09/05/2016, 10:32 AM

## 2016-09-05 NOTE — Progress Notes (Signed)
NURSING PROGRESS NOTE  Kelly Porter [id number] Discharge Data: 09/05/2016 12:56 PM Attending Provider: Shon Millet* [id number] NEVILL, MD     Lawernce Keas to be D/C'd to [facility] in [place] facility per MD order.  IV site discontinued. Report called to [place] at [facility].  Last Vital Signs:  Blood pressure 135/90, pulse 99, temperature 98.2 F (36.8 C), temperature source Oral, resp. rate 20, height 5\' 2"  (1.575 m), weight 46.4 kg (102 lb 4.7 oz), SpO2 95 %.  Discharge Medication List   Medication List    STOP taking these medications   cyclobenzaprine 5 MG tablet Commonly known as:  FLEXERIL   furosemide 20 MG tablet Commonly known as:  LASIX     TAKE these medications   acetaminophen 500 MG tablet Commonly known as:  TYLENOL Take 500 mg by mouth 2 (two) times daily.   acetaminophen 325 MG tablet Commonly known as:  TYLENOL Take 2 tablets (650 mg total) by mouth every 6 (six) hours as needed for mild pain (or Fever >/= 101).   Cranberry 500 MG Chew Chew 500 mg by mouth daily.   feeding supplement (ENSURE ENLIVE) Liqd Take 237 mLs by mouth daily.   ferrous sulfate 325 (65 FE) MG tablet Take 325 mg by mouth daily with breakfast.   metoprolol tartrate 25 MG tablet Commonly known as:  LOPRESSOR Take 1 tablet (25 mg total) by mouth 2 (two) times daily.   multivitamin with minerals Tabs tablet Take 1 tablet by mouth daily.   ondansetron 4 MG tablet Commonly known as:  ZOFRAN Take 1 tablet (4 mg total) by mouth every 6 (six) hours as needed for nausea.   pantoprazole 40 MG tablet Commonly known as:  PROTONIX Take 1 tablet (40 mg total) by mouth daily.   traMADol 50 MG tablet Commonly known as:  ULTRAM Take 0.5 tablets (25 mg total) by mouth every 6 (six) hours as needed for moderate pain or severe pain.   vitamin C 500 MG tablet Commonly known as:  ASCORBIC ACID Take 500 mg by mouth daily.   Vitamin D3 1000 units  Caps Take 1,000 Units by mouth 2 (two) times daily.

## 2016-09-05 NOTE — Progress Notes (Signed)
OT Cancellation Note  Patient Details Name: Kelly Porter MRN: [id number] DOB: 02-05-1919   Cancelled Treatment:    Reason Eval/Treat Not Completed: Other (comment). Pt to DC to SNF for rehab. Will defer OT to SNF. If eval needed, please page number below. Thanks  [place], OTR/L  [phone] 09/05/2016 09/05/2016, 10:29 AM

## 2016-09-05 NOTE — Progress Notes (Signed)
MEDICATION RELATED CONSULT NOTE  Pharmacy Consult for  IV IRON replacement  Indication: Anemia  No Known Allergies  Patient Measurements: Height: 5\' 2"  (157.5 cm) Weight: 102 lb 4.7 oz (46.4 kg) IBW/kg (Calculated) : 50.1   Vital Signs: Temp: 98.2 F (36.8 C) (11/19 0446) Temp Source: Oral (11/19 0446) BP: 135/90 (11/19 0446) Pulse Rate: 99 (11/19 0446) Intake/Output from previous day: 11/18 0701 - 11/19 0700 In: 300 [P.O.:300] Out: 950 [Urine:950] Intake/Output from this shift: Total I/O In: 420 [P.O.:420] Out: 750 [Urine:750]  Labs:  Recent Labs  09/03/16 0801 09/05/16 0813  WBC 7.8  --   HGB 7.8*  --   HCT 25.8*  --   PLT 220  --   CREATININE 0.75 0.78  MG  --  1.8  PHOS  --  2.4*   Estimated Creatinine Clearance: 30.8 mL/min (by C-G formula based on SCr of 0.78 mg/dL).   Microbiology: Recent Results (from the past 720 hour(s))  Blood culture (routine x 2)     Status: None (Preliminary result)   Collection Time: 08/31/16 12:20 PM  Result Value Ref Range Status   Specimen Description BLOOD RIGHT UPPER ARM  Final   Special Requests BOTTLES DRAWN AEROBIC AND ANAEROBIC 5CC  Final   Culture NO GROWTH 4 DAYS  Final   Report Status PENDING  Incomplete  Blood culture (routine x 2)     Status: None (Preliminary result)   Collection Time: 08/31/16  2:53 PM  Result Value Ref Range Status   Specimen Description BLOOD RIGHT HAND  Final   Special Requests IN PEDIATRIC BOTTLE 2CC  Final   Culture NO GROWTH 4 DAYS  Final   Report Status PENDING  Incomplete  MRSA PCR Screening     Status: Abnormal   Collection Time: 08/31/16  5:09 PM  Result Value Ref Range Status   MRSA by PCR POSITIVE (A) NEGATIVE Final    Comment:        The GeneXpert MRSA Assay (FDA approved for NASAL specimens only), is one component of a comprehensive MRSA colonization surveillance program. It is not intended to diagnose MRSA infection nor to guide or monitor treatment for MRSA  infections. RESULT CALLED TO, READ BACK BY AND VERIFIED WITH: M.YORK,RN AT 2149 BY L.PITT 08/31/16     Medical History: Past Medical History:  Diagnosis Date  . Anemia "always"  . Anxiety   . Arthritis    "back" (05/19/2015)  . Bowel obstruction   . Chronic lower back pain    "qd for the last month" (05/19/2015)  . History of blood transfusion 05/19/2015   "low HgB"  . Melanoma of face ([place])    "skin only; no other treatment"  . Pneumonia X 1    Medications:  Prescriptions Prior to Admission  Medication Sig Dispense Refill Last Dose  . acetaminophen (TYLENOL) 325 MG tablet Take 2 tablets (650 mg total) by mouth every 6 (six) hours as needed for mild pain (or Fever >/= 101).   Past Month at Unknown time  . acetaminophen (TYLENOL) 500 MG tablet Take 500 mg by mouth 2 (two) times daily.   08/30/2016 at Unknown time  . Cholecalciferol (VITAMIN D3) 1000 units CAPS Take 1,000 Units by mouth 2 (two) times daily.   08/30/2016 at Unknown time  . Cranberry 500 MG CHEW Chew 500 mg by mouth daily.   08/30/2016 at Unknown time  . feeding supplement, ENSURE ENLIVE, (ENSURE ENLIVE) LIQD Take 237 mLs by mouth daily.  08/30/2016 at Unknown time  . ferrous sulfate 325 (65 FE) MG tablet Take 325 mg by mouth daily with breakfast.   08/30/2016 at Unknown time  . furosemide (LASIX) 20 MG tablet Take 20 mg by mouth daily as needed for fluid or edema.   Past Month at Unknown time  . metoprolol tartrate (LOPRESSOR) 25 MG tablet Take 1 tablet (25 mg total) by mouth 2 (two) times daily. 60 tablet 0 08/30/2016 at 2200  . Multiple Vitamin (MULTIVITAMIN WITH MINERALS) TABS tablet Take 1 tablet by mouth daily.   08/30/2016 at Unknown time  . ondansetron (ZOFRAN) 4 MG tablet Take 1 tablet (4 mg total) by mouth every 6 (six) hours as needed for nausea. 20 tablet 0 Past Month at Unknown time  . pantoprazole (PROTONIX) 40 MG tablet Take 1 tablet (40 mg total) by mouth daily. 30 tablet 0 08/30/2016 at Unknown time  .  vitamin C (ASCORBIC ACID) 500 MG tablet Take 500 mg by mouth daily.   08/30/2016 at Unknown time  . [DISCONTINUED] traMADol (ULTRAM) 50 MG tablet Take 0.5 tablets (25 mg total) by mouth every 6 (six) hours as needed for moderate pain or severe pain. 30 tablet 0 Past Month at Unknown time  . cyclobenzaprine (FLEXERIL) 5 MG tablet Take 1 tablet (5 mg total) by mouth 3 (three) times daily as needed for muscle spasms. (Patient not taking: Reported on 08/31/2016) 30 tablet 0 Not Taking at Unknown time    Assessment: 82 y.o female with worsening Hgb  Hgb 9.7 >> 7.1 >7.4 IV Iron per pharmacy consult.  MD noted worsening hgb likely secondary to acute illness and IV fluid dilution Has h/o chronic iron defic anemia from chronic GI loss,  Iron panel results c/w iron deficiency : Iron <5, TIBC 224 low, Ferritin wnl  Ferritin  has slowly improved over the past several months-she is on iron supplementation at home (once daily FeSo4 325mg ) No overt evidence of blood loss at this time per Dr. Sloan Leiter She has received Feraheme IV in the past (05/20/15) per EPIC review  Plan to give second dose of Feraheme 510mg  IV to finish load since patient received first dose three days ago.   Goal of Therapy:  Improvement in Hgb  Plan:  Feraheme 510 mg IV x1 for second dose F/u CBC Patient is also on oral iron therapy   Demetrius Charity, PharmD Acute Care Pharmacy Resident  Pager: [phone] 09/05/2016

## 2016-10-04 ENCOUNTER — Encounter: Payer: Self-pay | Admitting: Emergency Medicine

## 2016-10-04 ENCOUNTER — Emergency Department: Payer: Medicare Other

## 2016-10-04 ENCOUNTER — Inpatient Hospital Stay
Admission: EM | Admit: 2016-10-04 | Discharge: 2016-10-05 | DRG: 871 | Disposition: A | Discharge status: Hospice | Payer: Medicare Other | Attending: Internal Medicine | Admitting: Internal Medicine

## 2016-10-04 DIAGNOSIS — F419 Anxiety disorder, unspecified: Secondary | ICD-10-CM | POA: Diagnosis present

## 2016-10-04 DIAGNOSIS — A419 Sepsis, unspecified organism: Principal | ICD-10-CM | POA: Diagnosis present

## 2016-10-04 DIAGNOSIS — J189 Pneumonia, unspecified organism: Secondary | ICD-10-CM | POA: Diagnosis present

## 2016-10-04 DIAGNOSIS — K219 Gastro-esophageal reflux disease without esophagitis: Secondary | ICD-10-CM | POA: Diagnosis present

## 2016-10-04 DIAGNOSIS — I10 Essential (primary) hypertension: Secondary | ICD-10-CM | POA: Diagnosis present

## 2016-10-04 DIAGNOSIS — G9341 Metabolic encephalopathy: Secondary | ICD-10-CM | POA: Diagnosis present

## 2016-10-04 DIAGNOSIS — Z22322 Carrier or suspected carrier of Methicillin resistant Staphylococcus aureus: Secondary | ICD-10-CM | POA: Diagnosis not present

## 2016-10-04 DIAGNOSIS — Z85820 Personal history of malignant melanoma of skin: Secondary | ICD-10-CM | POA: Diagnosis not present

## 2016-10-04 DIAGNOSIS — Z66 Do not resuscitate: Secondary | ICD-10-CM | POA: Diagnosis present

## 2016-10-04 DIAGNOSIS — Z515 Encounter for palliative care: Secondary | ICD-10-CM | POA: Diagnosis not present

## 2016-10-04 DIAGNOSIS — D509 Iron deficiency anemia, unspecified: Secondary | ICD-10-CM | POA: Diagnosis present

## 2016-10-04 DIAGNOSIS — R6520 Severe sepsis without septic shock: Secondary | ICD-10-CM | POA: Diagnosis present

## 2016-10-04 DIAGNOSIS — N179 Acute kidney failure, unspecified: Secondary | ICD-10-CM | POA: Diagnosis present

## 2016-10-04 DIAGNOSIS — K639 Disease of intestine, unspecified: Secondary | ICD-10-CM | POA: Diagnosis present

## 2016-10-04 DIAGNOSIS — J181 Lobar pneumonia, unspecified organism: Secondary | ICD-10-CM

## 2016-10-04 DIAGNOSIS — Z79899 Other long term (current) drug therapy: Secondary | ICD-10-CM

## 2016-10-04 DIAGNOSIS — Z993 Dependence on wheelchair: Secondary | ICD-10-CM

## 2016-10-04 LAB — URINALYSIS, COMPLETE (UACMP) WITH MICROSCOPIC
BACTERIA UA: NONE SEEN
BILIRUBIN URINE: NEGATIVE
GLUCOSE, UA: NEGATIVE mg/dL
Hgb urine dipstick: NEGATIVE
KETONES UR: 5 mg/dL — AB
Leukocytes, UA: NEGATIVE
Nitrite: NEGATIVE
PH: 5 (ref 5.0–8.0)
PROTEIN: NEGATIVE mg/dL
Specific Gravity, Urine: 1.018 (ref 1.005–1.030)

## 2016-10-04 LAB — COMPREHENSIVE METABOLIC PANEL
ALBUMIN: 3.1 g/dL — AB (ref 3.5–5.0)
ALT: 11 U/L — AB (ref 14–54)
AST: 36 U/L (ref 15–41)
Alkaline Phosphatase: 62 U/L (ref 38–126)
Anion gap: 15 (ref 5–15)
BUN: 52 mg/dL — AB (ref 6–20)
CHLORIDE: 99 mmol/L — AB (ref 101–111)
CO2: 22 mmol/L (ref 22–32)
CREATININE: 1.34 mg/dL — AB (ref 0.44–1.00)
Calcium: 8 mg/dL — ABNORMAL LOW (ref 8.9–10.3)
GFR calc Af Amer: 38 mL/min — ABNORMAL LOW (ref >60)
GFR, EST NON AFRICAN AMERICAN: 33 mL/min — AB (ref >60)
GLUCOSE: 179 mg/dL — AB (ref 65–99)
POTASSIUM: 4.3 mmol/L (ref 3.5–5.1)
SODIUM: 136 mmol/L (ref 135–145)
Total Bilirubin: 0.4 mg/dL (ref 0.3–1.2)
Total Protein: 5.8 g/dL — ABNORMAL LOW (ref 6.5–8.1)

## 2016-10-04 LAB — CBC WITH DIFFERENTIAL/PLATELET
Basophils Absolute: 0 10*3/uL (ref 0–0.1)
Basophils Relative: 0 %
EOS ABS: 0 10*3/uL (ref 0–0.7)
EOS PCT: 0 %
HCT: 31.8 % — ABNORMAL LOW (ref 35.0–47.0)
Hemoglobin: 10.4 g/dL — ABNORMAL LOW (ref 12.0–16.0)
LYMPHS ABS: 0.1 10*3/uL — AB (ref 1.0–3.6)
LYMPHS PCT: 3 %
MCH: 27.6 pg (ref 26.0–34.0)
MCHC: 32.8 g/dL (ref 32.0–36.0)
MCV: 84.1 fL (ref 80.0–100.0)
MONO ABS: 0.4 10*3/uL (ref 0.2–0.9)
MONOS PCT: 10 %
Neutro Abs: 3.4 10*3/uL (ref 1.4–6.5)
Neutrophils Relative %: 87 %
PLATELETS: 378 10*3/uL (ref 150–440)
RBC: 3.78 MIL/uL — AB (ref 3.80–5.20)
RDW: 16.8 % — ABNORMAL HIGH (ref 11.5–14.5)
WBC: 3.9 10*3/uL (ref 3.6–11.0)

## 2016-10-04 LAB — MRSA PCR SCREENING: MRSA by PCR: POSITIVE — AB

## 2016-10-04 LAB — PROCALCITONIN: Procalcitonin: 4.94 ng/mL

## 2016-10-04 LAB — INFLUENZA PANEL BY PCR (TYPE A & B)
INFLBPCR: NEGATIVE
Influenza A By PCR: NEGATIVE

## 2016-10-04 LAB — TROPONIN I: Troponin I: <0.03 ng/mL (ref <0.03)

## 2016-10-04 LAB — LACTIC ACID, PLASMA
LACTIC ACID, VENOUS: 4.4 mmol/L — AB (ref 0.5–1.9)
Lactic Acid, Venous: 4.4 mmol/L (ref 0.5–1.9)

## 2016-10-04 MED ORDER — SODIUM CHLORIDE 0.9 % IV BOLUS (SEPSIS)
500.0000 mL | Freq: Once | INTRAVENOUS | Status: AC
  Administered 2016-10-04: 500 mL via INTRAVENOUS

## 2016-10-04 MED ORDER — SODIUM CHLORIDE 0.9 % IV SOLN
INTRAVENOUS | Status: DC
Start: 2016-10-04 — End: 2016-10-04
  Administered 2016-10-04: 16:00:00 via INTRAVENOUS

## 2016-10-04 MED ORDER — SODIUM CHLORIDE 0.9% FLUSH: 3.0000 mL | Freq: Two times a day (BID) | INTRAVENOUS | Status: DC

## 2016-10-04 MED ORDER — ENOXAPARIN SODIUM 40 MG/0.4ML ~~LOC~~ SOLN: 40.0000 mg | SUBCUTANEOUS | Status: DC

## 2016-10-04 MED ORDER — ACETAMINOPHEN 325 MG PO TABS: 650.0000 mg | ORAL_TABLET | Freq: Four times a day (QID) | ORAL | Status: DC | PRN

## 2016-10-04 MED ORDER — VITAMIN D3 25 MCG (1000 UT) PO CAPS: 1000.0000 [IU] | ORAL_CAPSULE | Freq: Two times a day (BID) | ORAL | Status: DC

## 2016-10-04 MED ORDER — PANTOPRAZOLE SODIUM 40 MG PO TBEC: 40.0000 mg | DELAYED_RELEASE_TABLET | Freq: Every day | ORAL | Status: DC

## 2016-10-04 MED ORDER — VITAMIN C 500 MG PO TABS: 500.0000 mg | ORAL_TABLET | Freq: Every day | ORAL | Status: DC

## 2016-10-04 MED ORDER — PIPERACILLIN-TAZOBACTAM 3.375 G IVPB 30 MIN
3.3750 g | Freq: Once | INTRAVENOUS | Status: AC
  Administered 2016-10-04: 3.375 g via INTRAVENOUS
  Filled 2016-10-04: qty 50

## 2016-10-04 MED ORDER — VANCOMYCIN HCL IN DEXTROSE 1-5 GM/200ML-% IV SOLN
1000.0000 mg | Freq: Once | INTRAVENOUS | Status: AC
  Administered 2016-10-04: 1000 mg via INTRAVENOUS
  Filled 2016-10-04: qty 200

## 2016-10-04 MED ORDER — VANCOMYCIN HCL 500 MG IV SOLR: 500.0000 mg | INTRAVENOUS | Status: DC

## 2016-10-04 MED ORDER — METOPROLOL TARTRATE 25 MG PO TABS: 25.0000 mg | ORAL_TABLET | Freq: Two times a day (BID) | ORAL | Status: DC

## 2016-10-04 MED ORDER — ADULT MULTIVITAMIN W/MINERALS CH: 1.0000 | ORAL_TABLET | Freq: Every day | ORAL | Status: DC

## 2016-10-04 MED ORDER — VANCOMYCIN HCL IN DEXTROSE 750-5 MG/150ML-% IV SOLN: 750.0000 mg | INTRAVENOUS | Status: DC

## 2016-10-04 MED ORDER — PIPERACILLIN-TAZOBACTAM 3.375 G IVPB: 3.3750 g | Freq: Two times a day (BID) | INTRAVENOUS | Status: DC

## 2016-10-04 MED ORDER — FERROUS SULFATE 325 (65 FE) MG PO TABS: 325.0000 mg | ORAL_TABLET | Freq: Every day | ORAL | Status: DC

## 2016-10-04 MED ORDER — ONDANSETRON HCL 4 MG PO TABS: 4.0000 mg | ORAL_TABLET | Freq: Four times a day (QID) | ORAL | Status: DC | PRN

## 2016-10-04 MED ORDER — SODIUM CHLORIDE 0.9 % IV BOLUS (SEPSIS)
1000.0000 mL | Freq: Once | INTRAVENOUS | Status: AC
  Administered 2016-10-04: 1000 mL via INTRAVENOUS

## 2016-10-04 MED ORDER — ORAL CARE MOUTH RINSE: 15.0000 mL | Freq: Two times a day (BID) | OROMUCOSAL | Status: DC

## 2016-10-04 MED ORDER — MORPHINE SULFATE (PF) 4 MG/ML IV SOLN
1.0000 mg | INTRAVENOUS | Status: DC | PRN
  Administered 2016-10-05 (×3): 1 mg via INTRAVENOUS
  Filled 2016-10-04 (×3): qty 1

## 2016-10-04 MED ORDER — LORAZEPAM 2 MG/ML IJ SOLN: 0.5000 mg | INTRAMUSCULAR | Status: DC | PRN

## 2016-10-04 MED ORDER — ONDANSETRON HCL 4 MG/2ML IJ SOLN: 4.0000 mg | Freq: Four times a day (QID) | INTRAMUSCULAR | Status: DC | PRN

## 2016-10-04 MED ORDER — CEFEPIME-DEXTROSE 1 GM/50ML IV SOLR: 1.0000 g | Freq: Every day | INTRAVENOUS | Status: DC

## 2016-10-04 NOTE — Consult Note (Addendum)
Pharmacy Antibiotic Note  Kelly Porter is a 82 y.o. female admitted on 10/04/2016 with pneumonia and sepsis.  Pharmacy has been consulted for vancomycin and cefepime dosing.  Plan: Vancomycin 1g was given in ER. Will give next dose in 29 hours for stacked dosing. Vancomycin 750mg  q 48 hours goal trough 15-20 Cefepime 1g q 24hr   vanc trough prior to the 5th dose 25 dec @ 1630  Height: 5\' 2"  (157.5 cm) Weight: 102 lb (46.3 kg) IBW/kg (Calculated) : 50.1  Temp (24hrs), Avg:101.6 F (38.7 C), Min:101.6 F (38.7 C), Max:101.6 F (38.7 C)   Recent Labs Lab 10/04/16 1048  WBC 3.9  CREATININE 1.34*  LATICACIDVEN 4.4*    Estimated Creatinine Clearance: 18.4 mL/min (by C-G formula based on SCr of 1.34 mg/dL (H)).    No Known Allergies  Antimicrobials this admission: vancomycin 12/18 >>  zosyn 12/18 >> one dose Cefepime 12/18>>  Dose adjustments this admission:   Microbiology results: 12/18 BCx:  12/18 UCx:   12/18 MRSA PCR:  12/18 PCT:   Thank you for allowing pharmacy to be a part of this patient's care.  Ramond Dial, Pharm.D, BCPS Clinical Pharmacist  10/04/2016 1:22 PM

## 2016-10-04 NOTE — Progress Notes (Signed)
Palliative Medicine consult noted. Due to high referral volume, there may be a delay seeing this patient. Please call the Palliative Medicine Team office at [phone] if recommendations are needed in the interim.  Thank you for inviting [place] to see this patient.  Marjie Skiff Idella Lamontagne, RN, BSN, [facility] 10/04/2016 1:51 PM Cell [phone] 8:00-4:00 Monday-Friday Office [phone]

## 2016-10-04 NOTE — ED Notes (Signed)
Lab notified to collect repeat lactic acid.

## 2016-10-04 NOTE — Progress Notes (Signed)
Patient being admitted for severe sepsis. Unable to obtain palliative care at this time. Blood pressure dropping requiring IV fluid boluses. Patient hasn't responded significantly. Discussed with patient's healthcare power of attorney Ms. Bethena Roys and also her husband who have decided to make her comfort care and hospice evaluation tomorrow. Comfort care orders entered. The family would like to go to hospice of [facility].

## 2016-10-04 NOTE — ED Triage Notes (Signed)
Arrives via EMS from [facility].  Staff called EMS due to patient having altered mental status for the past day.  EMS report temperature of 104.3 temperal.   Room air spo2 was 90%, 4L/ [place] started and satis improved to 99%.  EMS also report a recent admission for Septic Shock due to bowel obstruction.

## 2016-10-04 NOTE — Progress Notes (Signed)
   [place] [facility] Day: 0 days Kelly Porter is a 82 y.o. female presenting with altered mental status and fevers. She is being admitted for sepsis. She was recently diagnosed with a possible colon mass last month at [facility] and has refused further evaluation or treatment..   Advance care planning discussed with patient and additional Family (Niece's husband) at bedside. All questions in regards to overall condition and expected prognosis answered. Her niece is her 13.  The decision was made to keep her DNR. Also palliative care consulted and hospice evaluation ordered as well  CODE STATUS: DNR Time spent: 18 minutes

## 2016-10-04 NOTE — ED Provider Notes (Signed)
[facility] Emergency Department Provider Note  Time seen: 10:46 AM  I have reviewed the triage vital signs and the nursing notes.   HISTORY  Chief Complaint No chief complaint on file.    HPI Kelly Porter is a 82 y.o. female With a past medical history hypertension, anemia, presents the emergency department from her nursing facility with a fever and altered mental status. According to EMS report the patient is normally alert and oriented 4, today she has very sluggish to respond and seems confused at times. Patient noted have a temperature of 104.9 by EMS. Here the patient is awake and alert, does answer simple questions and seems to be accurate. Denies any pain. States she does not know if she has been coughing. Was unaware that she had a fever. Denies abdominal pain. Patient cannot contribute much to her history at this time.  Past Medical History:  Diagnosis Date  . Anemia "always"  . Anxiety   . Arthritis    "back" (05/19/2015)  . Bowel obstruction   . Chronic lower back pain    "qd for the last month" (05/19/2015)  . History of blood transfusion 05/19/2015   "low HgB"  . Melanoma of face ([place])    "skin only; no other treatment"  . Pneumonia X 1    Patient Active Problem List   Diagnosis Date Noted  . Mass of colon 09/03/2016  . Enteritis 09/03/2016  . Symptomatic anemia   . Severe sepsis with septic shock ([place]) 08/31/2016  . Sepsis ([place]) 08/31/2016  . Bowel obstruction 08/31/2016  . AKI (acute kidney injury) ([place]) 08/31/2016  . Slurred speech 08/31/2016  . Hypertension 08/31/2016  . Chronic pain 08/31/2016  . Ileus ([place]) 06/13/2016  . Back pain   . Iron deficiency anemia due to chronic blood loss   . Fall (on)(from) incline, initial encounter   . Chronic GI bleeding     Past Surgical History:  Procedure Laterality Date  . ABDOMINAL EXPLORATION SURGERY     "thought it was her ovaries; found diverticulitis"  . MELANOMA EXCISION     face     Prior to Admission medications   Medication Sig Start Date End Date Taking? Authorizing Provider  acetaminophen (TYLENOL) 325 MG tablet Take 2 tablets (650 mg total) by mouth every 6 (six) hours as needed for mild pain (or Fever >/= 101). 06/17/16   Dustin Flock, MD  acetaminophen (TYLENOL) 500 MG tablet Take 500 mg by mouth 2 (two) times daily.    Historical Provider, MD  Cholecalciferol (VITAMIN D3) 1000 units CAPS Take 1,000 Units by mouth 2 (two) times daily.    Historical Provider, MD  Cranberry 500 MG CHEW Chew 500 mg by mouth daily.    Historical Provider, MD  feeding supplement, ENSURE ENLIVE, (ENSURE ENLIVE) LIQD Take 237 mLs by mouth daily.    Historical Provider, MD  ferrous sulfate 325 (65 FE) MG tablet Take 325 mg by mouth daily with breakfast.    Historical Provider, MD  metoprolol tartrate (LOPRESSOR) 25 MG tablet Take 1 tablet (25 mg total) by mouth 2 (two) times daily. 06/17/16   Dustin Flock, MD  Multiple Vitamin (MULTIVITAMIN WITH MINERALS) TABS tablet Take 1 tablet by mouth daily.    Historical Provider, MD  ondansetron (ZOFRAN) 4 MG tablet Take 1 tablet (4 mg total) by mouth every 6 (six) hours as needed for nausea. 06/17/16   Dustin Flock, MD  pantoprazole (PROTONIX) 40 MG tablet Take 1 tablet (40  mg total) by mouth daily. 05/22/15   [name], DO  traMADol (ULTRAM) 50 MG tablet Take 0.5 tablets (25 mg total) by mouth every 6 (six) hours as needed for moderate pain or severe pain. 09/05/16   Shon Millet, DO  vitamin C (ASCORBIC ACID) 500 MG tablet Take 500 mg by mouth daily.    Historical Provider, MD    No Known Allergies  No family history on file.  Social History Social History  Substance Use Topics  . Smoking status: Never Smoker  . Smokeless tobacco: Never Used  . Alcohol use No    Review of Systems Constitutional: was unaware of fever. Cardiovascular: Negative for chest pain. Respiratory: Negative for shortness of breath.it is unclear  if she is coughing. Gastrointestinal: Negative for abdominal pain Neurological: Negative for headache largely negative review of systems however likely limited by altered mental status.  ____________________________________________   PHYSICAL EXAM:  VITAL SIGNS: ED Triage Vitals  Enc Vitals Group     BP --      Pulse Rate 10/04/16 1043 (!) 132     Resp 10/04/16 1043 (!) 28     Temp --      Temp src --      SpO2 10/04/16 1043 97 %     Weight 10/04/16 1045 102 lb (46.3 kg)     Height 10/04/16 1045 5\' 2"  (1.575 m)     Head Circumference --      Peak Flow --      Pain Score --      Pain Loc --      Pain Edu? --      Excl. in [place]? --     Constitutional: alert, sitting in bed,  no acute distressanswer simple questions, can follow simple commands. Eyes: Normal exam ENT   Head: Normocephalic and atraumatic   Mouth/Throat: Mucous membranes are moist. Cardiovascular: regular rhythm and rate around 130 bpm. Respiratory: moderate tachypnea around 30 breaths per minute, no obvious rales, rhonchi or wheeze. Aeration is equal bilaterally. Gastrointestinal: Soft and nontender. No distention. reaction to abdominal palpation. Musculoskeletal: Nontender with normal range of motion in all extremities.  Neurologic:  Normal speech and language. No gross focal neurologic deficits  Skin:  Skin is warm, dry and intact.  Psychiatric: Mood and affect are normal.  ____________________________________________    EKG  EKG reviewed and interpreted by myself shows sinus tachycardia 131 bpm, narrow QRS, normal axis, normal intervals, nonspecific ST changes with lateral ST depression. No ST elevation.  ____________________________________________    RADIOLOGY  Chest x-ray consistent with right-sided pneumonia  ____________________________________________   INITIAL IMPRESSION / ASSESSMENT AND PLAN / ED COURSE  Pertinent labs & imaging results that were available during my care of the  patient were reviewed by me and considered in my medical decision making (see chart for details).  the patient presents the emergency department with a fever to 104. Patient initially 90% on room air increased to the upper 90s on 2 L. No home O2 requirement. Patient is tachypnea, tachycardic along with a significant fever meeting sepsis criteria. Sepsis protocols have been initiated. We will cover with empiric antibiotics, and closely monitor while awaiting lab and imaging results.  Chest x-ray consistent with right-sided pneumonia. Lactate greater than 4. Influenza negative. Patient will be covered with antibiotics, IV fluids are infusing. Patient will be admitted for pneumonia and sepsis. CRITICAL CARE Performed by: Harvest Dark   Total critical care time: 30 minutes  Critical care  time was exclusive of separately billable procedures and treating other patients.  Critical care was necessary to treat or prevent imminent or life-threatening deterioration.  Critical care was time spent personally by me on the following activities: development of treatment plan with patient and/or surrogate as well as nursing, discussions with consultants, evaluation of patient's response to treatment, examination of patient, obtaining history from patient or surrogate, ordering and performing treatments and interventions, ordering and review of laboratory studies, ordering and review of radiographic studies, pulse oximetry and re-evaluation of patient's condition.    ____________________________________________   FINAL CLINICAL IMPRESSION(S) / ED DIAGNOSES  sepsisPneumonia    Harvest Dark, MD 10/04/16 1204

## 2016-10-04 NOTE — H&P (Signed)
[facility] [place] NAME: Kelly Porter    MR#:  [id number]  DATE OF BIRTH:  Nov 19, 1918  DATE OF ADMISSION:  10/04/2016  PRIMARY CARE PHYSICIAN: Henrine Screws, MD   REQUESTING/REFERRING PHYSICIAN: Dr. Hinda Kehr  CHIEF COMPLAINT:  No chief complaint on file.   HISTORY OF PRESENT ILLNESS:  Kelly Porter  is a 82 y.o. female with a known history of Hypertension, anemia, arthritis, recent admission to [facility] 4 weeks ago and diagnosed to have bowel obstruction likely from a colon mass. Patient was discharged to rehabilitation at the time and now transition to assisted living facility about a week ago. She has opted for no further diagnosis or treatment and was in the process of being transitioned to hospice. She is brought in today due to fever of 102F, tachycardia and altered mental status. Her niece's husband at bedside and provides most of the history. Patient is alert but confused and falling back to sleep. She has been declining in the last 3 months. At baseline she is alert and oriented and able to make conversation. Physically has become much weaker and is wheelchair bound lately. She was noted to be sleepy all day yesterday with decreased oral intake. Overnight she was noted to have high-grade fevers and also very lethargic this morning. Urine analysis is pending at this time. Chest x-ray with possible right-sided infiltrate. So she is being admitted for sepsis.  PAST MEDICAL HISTORY:   Past Medical History:  Diagnosis Date  . Anemia "always"  . Anxiety   . Arthritis    "back" (05/19/2015)  . Bowel obstruction   . Chronic lower back pain    "qd for the last month" (05/19/2015)  . History of blood transfusion 05/19/2015   "low HgB"  . Melanoma of face ([place])    "skin only; no other treatment"  . Pneumonia X 1    PAST SURGICAL HISTORY:   Past Surgical History:  Procedure Laterality Date  . ABDOMINAL EXPLORATION  SURGERY     "thought it was her ovaries; found diverticulitis"  . MELANOMA EXCISION     face    SOCIAL HISTORY:   Social History  Substance Use Topics  . Smoking status: Never Smoker  . Smokeless tobacco: Never Used  . Alcohol use No    FAMILY HISTORY:   Family History  Problem Relation Age of Onset  . Family history unknown: Yes    DRUG ALLERGIES:  No Known Allergies  REVIEW OF SYSTEMS:   Review of Systems  Unable to perform ROS: Mental status change    MEDICATIONS AT HOME:   Prior to Admission medications   Medication Sig Start Date End Date Taking? Authorizing Provider  acetaminophen (TYLENOL) 325 MG tablet Take 2 tablets (650 mg total) by mouth every 6 (six) hours as needed for mild pain (or Fever >/= 101). 06/17/16   Dustin Flock, MD  acetaminophen (TYLENOL) 500 MG tablet Take 500 mg by mouth 2 (two) times daily.    Historical Provider, MD  Cholecalciferol (VITAMIN D3) 1000 units CAPS Take 1,000 Units by mouth 2 (two) times daily.    Historical Provider, MD  Cranberry 500 MG CHEW Chew 500 mg by mouth daily.    Historical Provider, MD  feeding supplement, ENSURE ENLIVE, (ENSURE ENLIVE) LIQD Take 237 mLs by mouth daily.    Historical Provider, MD  ferrous sulfate 325 (65 FE) MG tablet Take 325 mg by mouth daily with breakfast.  Historical Provider, MD  metoprolol tartrate (LOPRESSOR) 25 MG tablet Take 1 tablet (25 mg total) by mouth 2 (two) times daily. 06/17/16   Dustin Flock, MD  Multiple Vitamin (MULTIVITAMIN WITH MINERALS) TABS tablet Take 1 tablet by mouth daily.    Historical Provider, MD  ondansetron (ZOFRAN) 4 MG tablet Take 1 tablet (4 mg total) by mouth every 6 (six) hours as needed for nausea. 06/17/16   Dustin Flock, MD  pantoprazole (PROTONIX) 40 MG tablet Take 1 tablet (40 mg total) by mouth daily. 05/22/15   [name], DO  traMADol (ULTRAM) 50 MG tablet Take 0.5 tablets (25 mg total) by mouth every 6 (six) hours as needed for moderate pain or  severe pain. 09/05/16   Shon Millet, DO  vitamin C (ASCORBIC ACID) 500 MG tablet Take 500 mg by mouth daily.    Historical Provider, MD      VITAL SIGNS:  Blood pressure 120/72, pulse (!) 132, temperature (!) 101.6 F (38.7 C), temperature source Rectal, resp. rate (!) 28, height 5\' 2"  (1.575 m), weight 46.3 kg (102 lb), SpO2 97 %.  PHYSICAL EXAMINATION:   Physical Exam  GENERAL:  82 y.o.-year-old malnourished patient lying in the bed with no acute distress.  EYES: Pupils equal, round, reactive to light and accommodation. No scleral icterus. Extraocular muscles intact.  HEENT: Head atraumatic, normocephalic. Oropharynx and nasopharynx clear.  NECK:  Supple, no jugular venous distention. No thyroid enlargement, no tenderness.  LUNGS: Normal breath sounds bilaterally, no wheezing, rales,rhonchi or crepitation. No use of accessory muscles of respiration.  CARDIOVASCULAR: S1, S2 normal. No rubs, or gallops. 3/6 systolic murmur present. ABDOMEN: Soft, nontender, nondistended. Bowel sounds present. No organomegaly or mass.  EXTREMITIES: No pedal edema, cyanosis, or clubbing.  NEUROLOGIC: Very confused, easily aroused. Not following commands at this time. But able to move all 4 extremities in bed. PSYCHIATRIC: The patient is alert and not following commands.  SKIN: No obvious rash, lesion, or ulcer.   LABORATORY PANEL:   CBC  Recent Labs Lab 10/04/16 1048  WBC 3.9  HGB 10.4*  HCT 31.8*  PLT 378   ------------------------------------------------------------------------------------------------------------------  Chemistries   Recent Labs Lab 10/04/16 1048  NA 136  K 4.3  CL 99*  CO2 22  GLUCOSE 179*  BUN 52*  CREATININE 1.34*  CALCIUM 8.0*  AST 36  ALT 11*  ALKPHOS 62  BILITOT 0.4   ------------------------------------------------------------------------------------------------------------------  Cardiac Enzymes  Recent Labs Lab 10/04/16 1048    TROPONINI <0.03   ------------------------------------------------------------------------------------------------------------------  RADIOLOGY:  Dg Chest Port 1 View  Result Date: 10/04/2016 CLINICAL DATA:  Altered mental status since yesterday with fever. EXAM: PORTABLE CHEST 1 VIEW COMPARISON:  08/31/2016 FINDINGS: Patchy airspace disease noted relatively diffusely in the right lung, new in the interval. Left lung is hyperexpanded but clear. The cardiopericardial silhouette is within normal limits for size. Bones are diffusely demineralized. Telemetry leads overlie the chest. IMPRESSION: 1. New patchy airspace disease diffusely in the right lung, compatible with pneumonia. 2. Underlying emphysema. Electronically Signed   By: Misty Stanley M.D.   On: 10/04/2016 11:12    EKG:   Orders placed or performed during the hospital encounter of 10/04/16  . ED EKG 12-Lead  . ED EKG 12-Lead  . EKG 12-Lead  . EKG 12-Lead    IMPRESSION AND PLAN:   Kelly Porter  is a 82 y.o. female with a known history of Hypertension, anemia, arthritis, recent admission to [facility] 4 weeks ago  and diagnosed to have bowel obstruction likely from a colon mass. She is brought in today due to fever of 102F, tachycardia and altered mental status.  #1 sepsis: Fever, tachycardia. -Flu test is negative. Chest x-ray with possible right-sided infiltrate. Urine analysis is pending. -Blood cultures and urine cultures ordered. -Cover with vancomycin and cefepime at this time.  #2 altered mental status-likely metabolic encephalopathy from sepsis. -No focal neuro deficits. Continue to monitor at this time.  #3 acute renal failure-likely prerenal and ATN from sepsis. -Gentle hydration  #4 deficiency anemia-recently received IV iron infusions last month while at [facility]. -Hemoglobin is stable at this time. Monitor closely  #5 GERD-on PPI  #6 DVT prophylaxis-on Lovenox  Discussed with patient's family at  bedside. Palliative care has been consulted. Possible hospice evaluation as well. Patient is a DO NOT RESUSCITATE.    All the records are reviewed and case discussed with ED provider. Management plans discussed with the patient, family and they are in agreement.  CODE STATUS: DNR  TOTAL TIME TAKING CARE OF THIS PATIENT: 55 minutes.    Gladstone Lighter M.D on 10/04/2016 at 1:01 PM  Between 7am to 6pm - Pager - [phone]  After 6pm go to [email or web address] - password [email or web address]  [facility] [place]  Office  [phone]  CC: Primary care physician; Henrine Screws, MD

## 2016-10-04 NOTE — ED Notes (Signed)
Sepsis team called stating pt needed reassessment of volume status. Admitting doctor informed.

## 2016-10-05 LAB — URINE CULTURE: Culture: NO GROWTH

## 2016-10-05 NOTE — Discharge Summary (Signed)
[facility] at [place] NAME: Kelly Porter    MR#:  [id number]  DATE OF BIRTH:  01-28-1919  DATE OF ADMISSION:  10/04/2016 ADMITTING PHYSICIAN: Gladstone Lighter, MD  DATE OF DISCHARGE: 10/05/16  PRIMARY CARE PHYSICIAN: Henrine Screws, MD    ADMISSION DIAGNOSIS:  Sepsis, due to unspecified organism ([place]) [A41.9] Community acquired pneumonia of right middle lobe of lung ([place]) [J18.1]  DISCHARGE DIAGNOSIS:  Active Problems:   Sepsis ([place]) Community acquired pneumonia Acute renal failure  SECONDARY DIAGNOSIS:   Past Medical History:  Diagnosis Date  . Anemia "always"  . Anxiety   . Arthritis    "back" (05/19/2015)  . Bowel obstruction   . Chronic lower back pain    "qd for the last month" (05/19/2015)  . History of blood transfusion 05/19/2015   "low HgB"  . Melanoma of face ([place])    "skin only; no other treatment"  . Pneumonia X 1    HOSPITAL COURSE:  Kelly Porter  is a 82 y.o. female admitted 10/04/2016 with chief complaint altered mental status. Please see H&P performed by Gladstone Lighter, MD for further information. Patient presented with the above symptoms with meeting septic criteria on admission. Started on broad antibiotics with IV fluids. Despite care she continued to decline. Case discussed with family and patient was transitioned to comfort care with transition to hospice home.   DISCHARGE CONDITIONS:   hospice  CONSULTS OBTAINED:    DRUG ALLERGIES:  No Known Allergies  DISCHARGE MEDICATIONS:   Current Discharge Medication List    CONTINUE these medications which have NOT CHANGED   Details  bisacodyl (DULCOLAX) 10 MG suppository Place 10 mg rectally as needed for moderate constipation.    pantoprazole (PROTONIX) 40 MG tablet Take 1 tablet (40 mg total) by mouth daily. Qty: 30 tablet, Refills: 0    sennosides-docusate sodium (SENOKOT-S) 8.6-50 MG tablet Take 1 tablet by mouth as needed for constipation.     acetaminophen (TYLENOL) 325 MG tablet Take 2 tablets (650 mg total) by mouth every 6 (six) hours as needed for mild pain (or Fever >/= 101).    ondansetron (ZOFRAN) 4 MG tablet Take 1 tablet (4 mg total) by mouth every 6 (six) hours as needed for nausea. Qty: 20 tablet, Refills: 0    traMADol (ULTRAM) 50 MG tablet Take 0.5 tablets (25 mg total) by mouth every 6 (six) hours as needed for moderate pain or severe pain. Qty: 30 tablet, Refills: 0      STOP taking these medications     Cholecalciferol (VITAMIN D3) 1000 units CAPS      Cranberry 450 MG TABS      metoprolol tartrate (LOPRESSOR) 25 MG tablet      Multiple Vitamin (MULTIVITAMIN WITH MINERALS) TABS tablet      vitamin C (ASCORBIC ACID) 500 MG tablet      feeding supplement, ENSURE ENLIVE, (ENSURE ENLIVE) LIQD          DISCHARGE INSTRUCTIONS:    DIET:  Regular diet  DISCHARGE CONDITION:  hospice  ACTIVITY:  Activity as tolerated  OXYGEN:  Home Oxygen: No.   Oxygen Delivery: room air  DISCHARGE LOCATION:  hospice   If you experience worsening of your admission symptoms, develop shortness of breath, life threatening emergency, suicidal or homicidal thoughts you must seek medical attention immediately by calling 911 or calling your MD immediately  if symptoms less severe.  You Must read complete instructions/literature along with all the  possible adverse reactions/side effects for all the Medicines you take and that have been prescribed to you. Take any new Medicines after you have completely understood and accpet all the possible adverse reactions/side effects.   Please note  You were cared for by a hospitalist during your hospital stay. If you have any questions about your discharge medications or the care you received while you were in the hospital after you are discharged, you can call the unit and asked to speak with the hospitalist on call if the hospitalist that took care of you is not available.  Once you are discharged, your primary care physician will handle any further medical issues. Please note that NO REFILLS for any discharge medications will be authorized once you are discharged, as it is imperative that you return to your primary care physician (or establish a relationship with a primary care physician if you do not have one) for your aftercare needs so that they can reassess your need for medications and monitor your lab values.    On the day of Discharge:   VITAL SIGNS:  Blood pressure (!) 125/58, pulse (!) 112, temperature 97.7 F (36.5 C), temperature source Oral, resp. rate (!) 32, height 5\' 2"  (1.575 m), weight 46.3 kg (102 lb), SpO2 99 %.  I/O:   Intake/Output Summary (Last 24 hours) at 10/05/16 0952 Last data filed at 10/04/16 1754  Gross per 24 hour  Intake             3750 ml  Output                0 ml  Net             3750 ml    PHYSICAL EXAMINATION:   VITAL SIGNS: Vitals:   10/04/16 1649 10/05/16 0406  BP: (!) 105/48 (!) 125/58  Pulse: (!) 107 (!) 112  Resp:  (!) 32  Temp: 98.9 F (37.2 C) 97.7 F (36.5 C)   GENERAL:82 y.o.female appears comfortable HEAD: Normocephalic, atraumatic.  EYES: Pupils equal, round, reactive to light. Unable to assess extraocular muscles given mental status/medical condition. No scleral icterus.  MOUTH: dry mucosal membrane. Dentition intact. No abscess noted.  EAR, NOSE, THROAT: Clear without exudates. No external lesions.  NECK: Supple. No thyromegaly. No nodules. No JVD.  PULMONARY: Clear to ascultation, without wheeze rails or rhonci. No use of accessory muscles, Good respiratory effort. good air entry bilaterally CHEST: Nontender to palpation.  CARDIOVASCULAR: S1 and S2. Regular rate and rhythm. No murmurs, rubs, or gallops. No edema. Pedal pulses 2+ bilaterally.  GASTROINTESTINAL: Soft, nontender, nondistended. No masses. Positive bowel sounds. No hepatosplenomegaly.  MUSCULOSKELETAL: No swelling, clubbing, or  edema. Range of motion full in all extremities.  NEUROLOGIC: Unable to assess given mental status/medical condition SKIN: No ulceration, lesions, rashes, or cyanosis. Skin warm and dry. Turgor intact.  PSYCHIATRIC: Unable to assess given mental status/medical condition   DATA REVIEW:   CBC  Recent Labs Lab 10/04/16 1048  WBC 3.9  HGB 10.4*  HCT 31.8*  PLT 378    Chemistries   Recent Labs Lab 10/04/16 1048  NA 136  K 4.3  CL 99*  CO2 22  GLUCOSE 179*  BUN 52*  CREATININE 1.34*  CALCIUM 8.0*  AST 36  ALT 11*  ALKPHOS 62  BILITOT 0.4    Cardiac Enzymes  Recent Labs Lab 10/04/16 1048  TROPONINI <0.03    Microbiology Results  Results for orders placed or performed during the hospital  encounter of 10/04/16  Blood Culture (routine x 2)     Status: None (Preliminary result)   Collection Time: 10/04/16 10:48 AM  Result Value Ref Range Status   Specimen Description BLOOD RIGHT ARM  Final   Special Requests   Final    BOTTLES DRAWN AEROBIC AND ANAEROBIC AER 10ML ANA 6ML   Culture NO GROWTH < 24 HOURS  Final   Report Status PENDING  Incomplete  Blood Culture (routine x 2)     Status: None (Preliminary result)   Collection Time: 10/04/16 11:27 AM  Result Value Ref Range Status   Specimen Description BLOOD LEFT ARM  Final   Special Requests   Final    BOTTLES DRAWN AEROBIC AND ANAEROBIC AER 4 ML ANA 1 ML   Culture NO GROWTH < 24 HOURS  Final   Report Status PENDING  Incomplete  MRSA PCR Screening     Status: Abnormal   Collection Time: 10/04/16  6:00 PM  Result Value Ref Range Status   MRSA by PCR POSITIVE (A) NEGATIVE Final    Comment:        The GeneXpert MRSA Assay (FDA approved for NASAL specimens only), is one component of a comprehensive MRSA colonization surveillance program. It is not intended to diagnose MRSA infection nor to guide or monitor treatment for MRSA infections. RESULT CALLED TO, READ BACK BY AND VERIFIED WITH: DOROTHY RIGUERA  10/04/16 @ 2000  Plain:  Dg Chest Port 1 View  Result Date: 10/04/2016 CLINICAL DATA:  Altered mental status since yesterday with fever. EXAM: PORTABLE CHEST 1 VIEW COMPARISON:  08/31/2016 FINDINGS: Patchy airspace disease noted relatively diffusely in the right lung, new in the interval. Left lung is hyperexpanded but clear. The cardiopericardial silhouette is within normal limits for size. Bones are diffusely demineralized. Telemetry leads overlie the chest. IMPRESSION: 1. New patchy airspace disease diffusely in the right lung, compatible with pneumonia. 2. Underlying emphysema. Electronically Signed   By: Misty Stanley M.D.   On: 10/04/2016 11:12     Management plans discussed with the patient, family and they are in agreement.  CODE STATUS:     Code Status Orders        Start     Ordered   10/04/16 1709  Do not attempt resuscitation (DNR)  Continuous    Question Answer Comment  In the event of cardiac or respiratory ARREST Do not call a "code blue"   In the event of cardiac or respiratory ARREST Do not perform Intubation, CPR, defibrillation or ACLS   In the event of cardiac or respiratory ARREST Use medication by any route, position, wound care, and other measures to relive pain and suffering. May use oxygen, suction and manual treatment of airway obstruction as needed for comfort.      10/04/16 1708    Code Status History    Date Active Date Inactive Code Status Order ID Comments User Context   10/04/2016  4:42 PM 10/04/2016  5:08 PM Full Code [id number]  Gladstone Lighter, MD Inpatient   10/04/2016 12:31 PM 10/04/2016  4:41 PM DNR [id number]  Gladstone Lighter, MD ED   08/31/2016  3:27 PM 09/05/2016  4:19 PM DNR [id number]  Waldemar Dickens, MD ED   06/13/2016  5:51 PM 06/17/2016  8:20 PM DNR [id number]  Nicholes Mango, MD Inpatient   06/13/2016  2:54 PM 06/13/2016  5:51 PM DNR [id number]  Merlyn Lot, MD ED   05/20/2015  3:48 AM 05/22/2015  6:48 PM DNR [id number]  Corky Sox, MD Inpatient   05/19/2015 10:14 PM 05/20/2015  3:48 AM Full Code [id number]  Corky Sox, MD Inpatient    Advance Directive Documentation   Flowsheet Row Most Recent Value  Type of Advance Directive  Healthcare Power of Attorney, Living will  Pre-existing out of facility DNR order (yellow form or pink MOST form)  No data  "MOST" Form in Place?  No data      TOTAL TIME TAKING CARE OF THIS PATIENT: 33 minutes.    Nhi Butrum,  Karenann Cai.D on 10/05/2016 at 9:52 AM  Between 7am to 6pm - Pager - [phone]  After 6pm go to [email or web address] - Proofreader  [facility]  Office  [phone]  CC: Primary care physician; Henrine Screws, MD

## 2016-10-05 NOTE — Progress Notes (Signed)
Kelly Porter was invited to meet with the Hospice representative and 2 nieces on a consult. [place] observed the procedure for admittance into a Hospice house. Kelly Porter was appreciative of the invitation and offered the ministry of presence.    10/05/16 1200  Clinical Encounter Type  Visited With Family;Health care provider  Visit Type Initial;Spiritual support  Referral From Nurse

## 2016-10-05 NOTE — Progress Notes (Signed)
New hospice home referral received from [place]. Kelly Porter is a 82 year old woman admitted to [facility] on 12/18 for evaluation of sepsis, with elevated temperature, lactic acid and tachycardia. Family chose not to have this treated and wants to focus on comfort as they have seen a decline in her functional status over the past 3 months.  Patient seen lying in bed, awake, niece Kelly Porter at bedside. Patient reported being thirsty and was able to drink water from a straw. She denied pain during visit, stated she was "feeling weak".  Per chart review she has required 2 doses of IV morphine since 2:00 am for pain. Writer met in the family room with patient's niece Kelly Porter [facility]) and niece Kelly Porter to initiate education regarding hospice services, philosophy and team approach to care with good understanding voiced. Consents signed. Plan is for patient to discharge today to the hospice home via EMS, signed DNR in place in discharge packet. All patinet information faxed to referral, report called to the Hospice home. EMS notified for transport. Family and hospital care team all aware. Thank you for the opportunity to be involved in the care of this patient and her family. Flo Shanks RN, BSN, [place] [facility] [place], [facility] [phone] c

## 2016-10-05 NOTE — Progress Notes (Signed)
Nutrition Brief Note  Patient identified to be seen for Low [place]. Chart reviewed. Patient now transitioning to comfort care. Per chart patient will discharge to [facility]. Discharge summary in.  Patient is NPO. No nutrition interventions warranted at this time. Please consult Dietitian as needed.   Willey Blade, MS, RD, LDN Pager: [phone] After Hours Pager: [phone]

## 2016-10-05 NOTE — Clinical Social Work Note (Signed)
Patient to be d/c'ed today to [facility].  Patient and family agreeable to plans will transport via ems hospice house nurse RN to call report.  Evette Cristal, MSW, LCSWA Mon-Fri 8a-4:30p [phone]

## 2016-10-05 NOTE — Clinical Social Work Note (Signed)
Patient is from [facility] in [place], however patient and family would like her to go to [facility] since patient's brother is in there as well.  CSW contacted [facility], and she is aware of the referral.  CSW to facilitate discharge planning.  Jones Broom. Pavo, MSW, [place]  Mon-Fri 8a-4:30p 10/05/2016 9:53 AM

## 2016-10-05 NOTE — Care Management (Signed)
Patient presents from [facility] with altered mental status and high fever.  Admitted with sepsis. Recent discharge from [facility] for bowel obstruction from a colon mass.Palliative care was not able to assess 12.18.  There is an order for comfort care.

## 2016-10-05 NOTE — Progress Notes (Signed)
Pt is alert to self.  Will be transferring to [facility] today. She complains of abdominal pain and 1 mg morphine was given 2x.  Patient also c/o thirst and tolerated drinking ice water and Hospice nurse liason gave her ginger ale.  Patient is able to state that she needs to go to the [facility] and voided in a bedpan.  IVs have been removed.  Hospice liason has called report and EMS has been called for transport.

## 2016-10-09 LAB — CULTURE, BLOOD (ROUTINE X 2)
CULTURE: NO GROWTH
Culture: NO GROWTH

## 2016-10-18 DEATH — deceased

## 2017-11-18 IMAGING — RF DG BE W/ CM - WO/W KUB
14 of 16 series · 14 of 16 positions shown · non-contrast
Comparison: CT on 08/31/2016

CLINICAL DATA: Anemia. Distal small bowel obstruction. Right
colonic mass suspected on recent CT.

EXAM:
SINGLE COLUMN BARIUM ENEMA
TECHNIQUE: Initial scout AP supine abdominal image obtained to insure adequate
colon cleansing. Barium was introduced into the colon in a
retrograde fashion and refluxed from the rectum to the cecum. Spot
images of the colon followed by overhead radiographs were obtained.
FLUOROSCOPY TIME:  Fluoroscopy Time: 3 minutes 55 seconds ; low dose
pulsed fluoroscopy
Number of Acquired Spot Images: 0

[Series 1: run · 1 of 1 slices shown (1 of 14)]
[im 1/1]
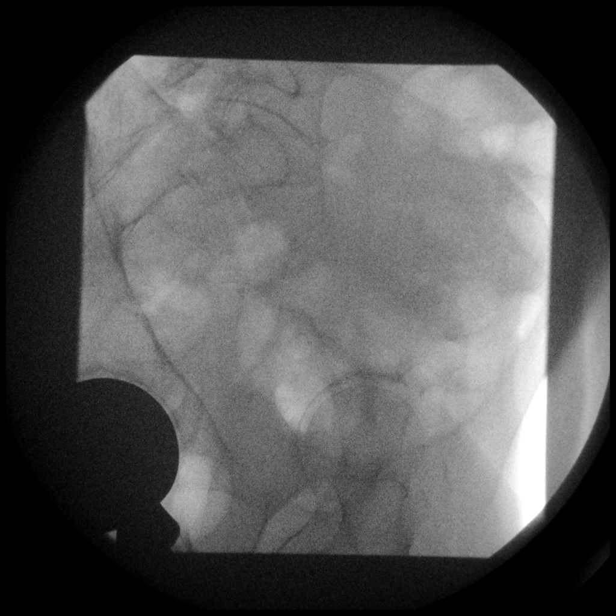

[Series 2: run · 1 of 1 slices shown (2 of 14)]
[im 1/1]
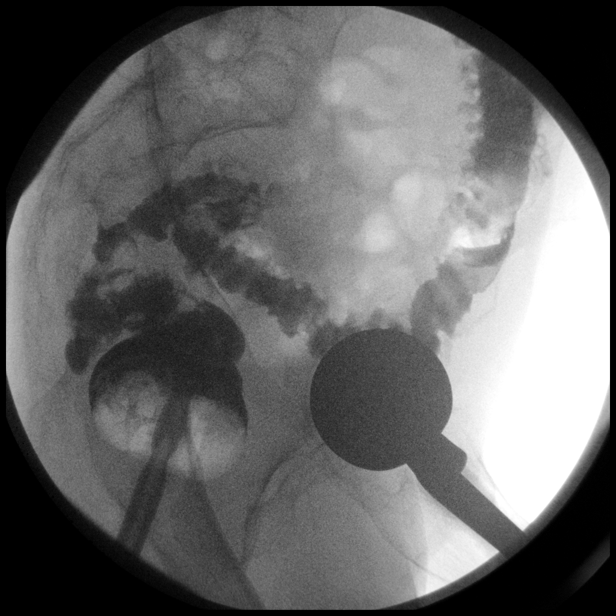

[Series 3: run · 1 of 1 slices shown (3 of 14)]
[im 1/1]
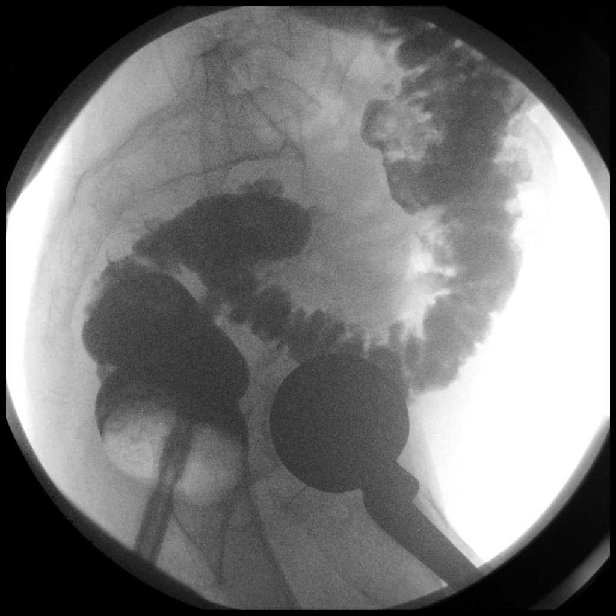

[Series 5: run · 1 of 1 slices shown (4 of 14)]
[im 1/1]
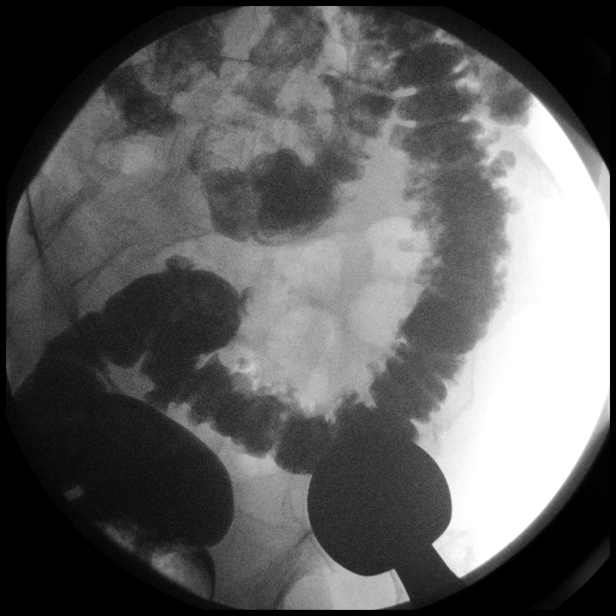

[Series 6: run · 1 of 1 slices shown (5 of 14)]
[im 1/1]
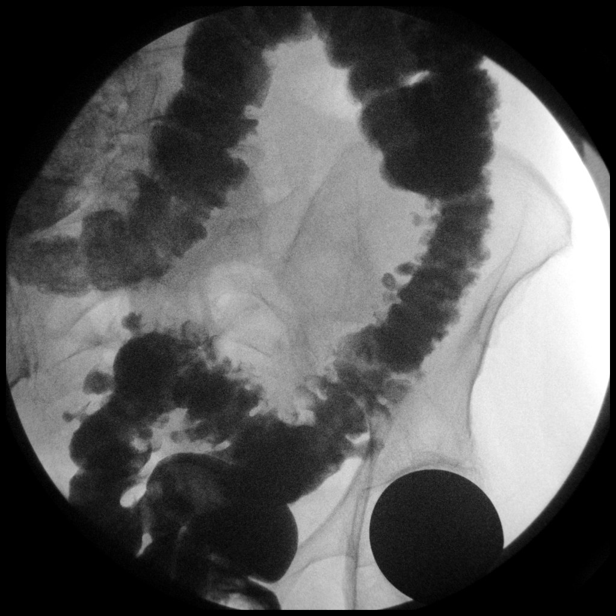

[Series 7: run · 1 of 1 slices shown (6 of 14)]
[im 1/1]
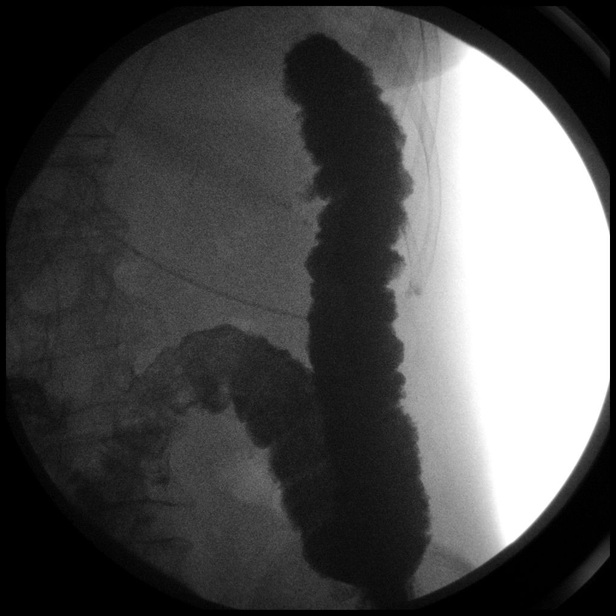

[Series 8: run · 1 of 1 slices shown (7 of 14)]
[im 1/1]
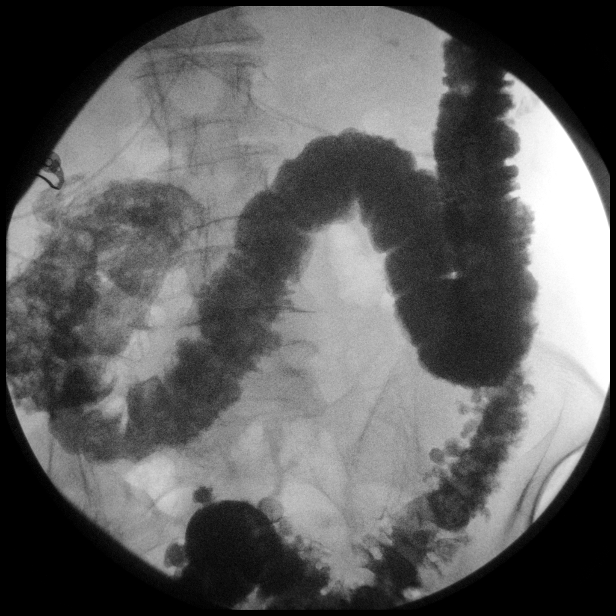

[Series 9: run · 1 of 1 slices shown (8 of 14)]
[im 1/1]
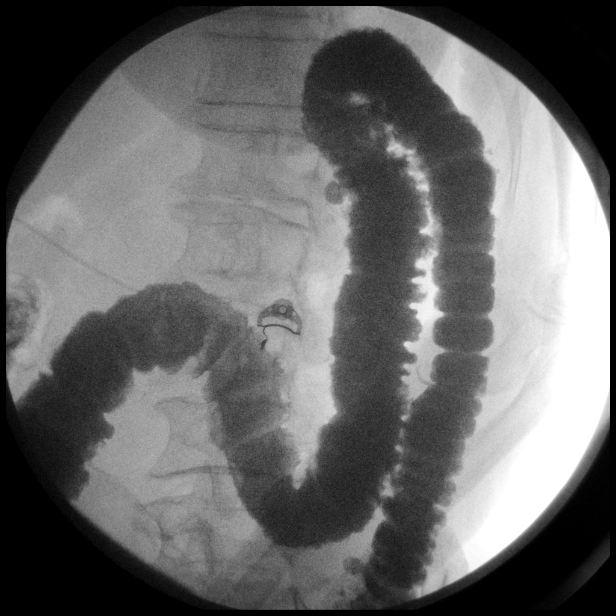

[Series 10: run · 1 of 1 slices shown (9 of 14)]
[im 1/1]
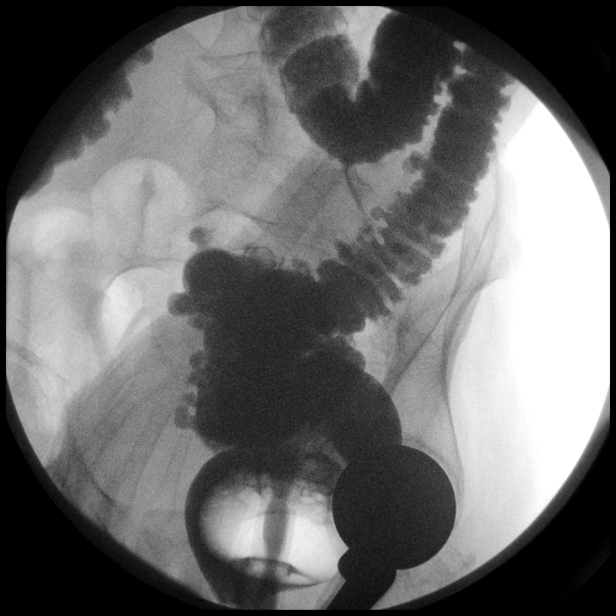

[Series 11: run · 1 of 1 slices shown (10 of 14)]
[im 1/1]
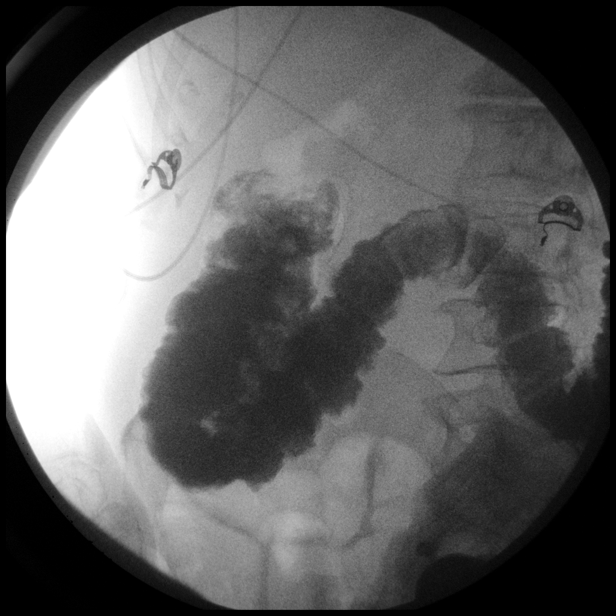

[Series 13: run · 1 of 1 slices shown (11 of 14)]
[im 1/1]
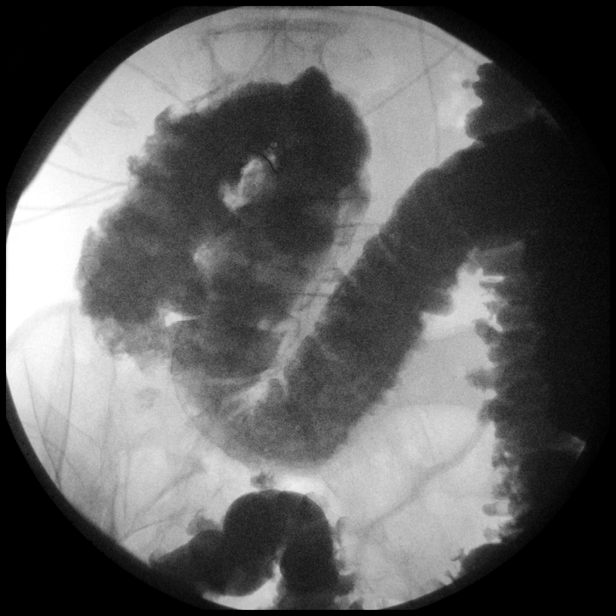

[Series 14: run · 1 of 1 slices shown (12 of 14)]
[im 1/1]
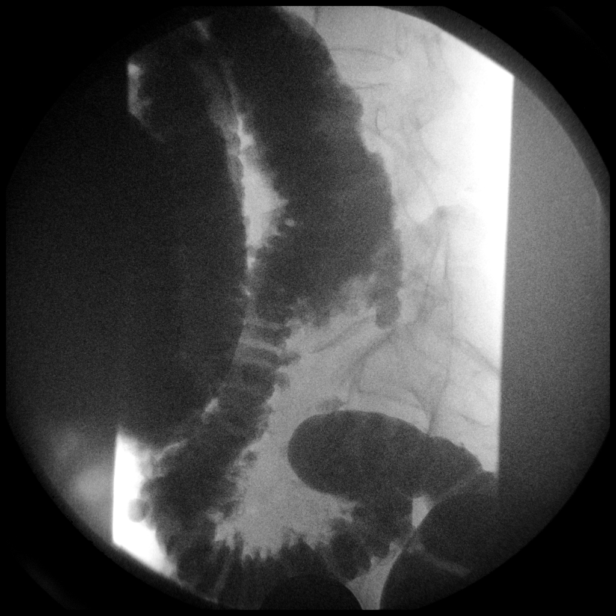

[Series 15: run · 1 of 1 slices shown (13 of 14)]
[im 1/1]
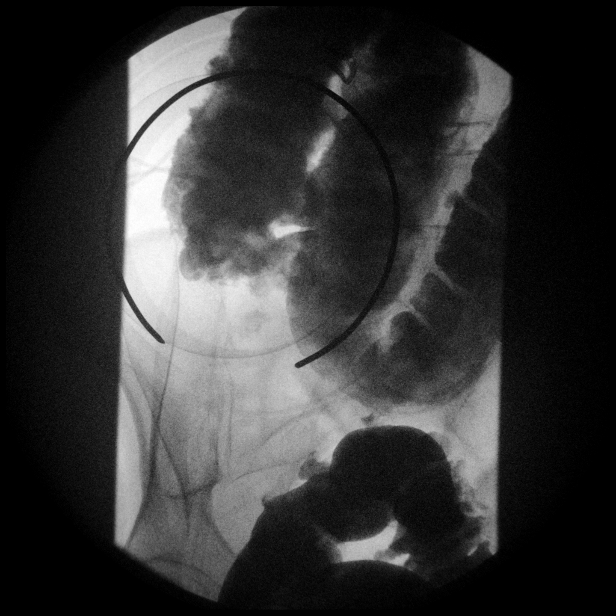

[Series 16: run · 1 of 1 slices shown (14 of 14)]
[im 1/1]
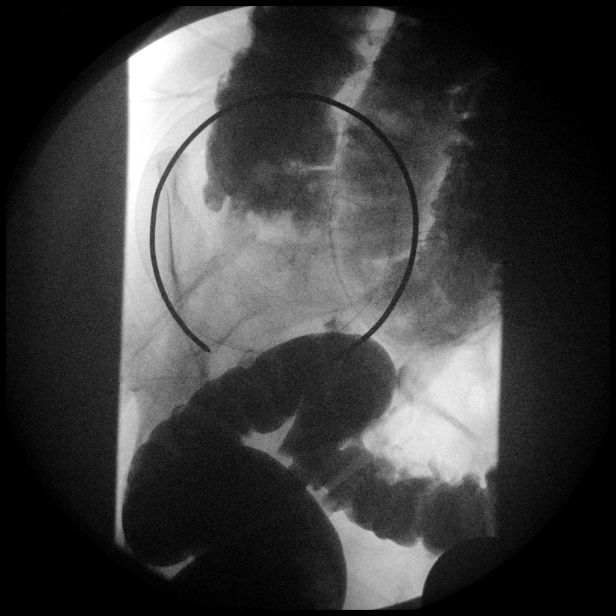

[14 of 16 positions shown; findings below may reference images not displayed]

FINDINGS: Scout radiograph shows mild dilatation of distal small bowel loops,
suspicious for partial distal small bowel obstruction as
demonstrated on recent CT.

Single column barium enema was performed. Severe diverticulosis is
seen predominately involving the descending and sigmoid colon. No
evidence of diverticulitis.

Complete colonic obstruction is seen involving the proximal to mid
ascending colon. No opacification of the cecum is seen. This has an
abrupt masslike margin, and corresponds with the site of suspected
mass on recent CT. This is highly suspicious for obstructing colon
carcinoma.

No other colonic masses or strictures identified.
IMPRESSION: Obstructing lesion in the proximal to mid ascending colon, highly
suspicious for obstructing colon carcinoma. No other colonic masses
or strictures identified.

Severe diverticulosis involving the descending and sigmoid colon.

These results were discussed by telephone at the time of
interpretation on 09/03/2016 at [DATE] with Dr. Reverien, who
verbally acknowledged these results.

## 2020-03-18 DEATH — deceased
